# Patient Record
Sex: Male | Born: 1980 | Race: Black or African American | Hispanic: No | Marital: Married | Smoking: Never smoker
Health system: Southern US, Community
[De-identification: ages and names within clinical notes are randomized; demographics above are authoritative.]

## PROBLEM LIST (undated history)

## (undated) DIAGNOSIS — I10 Essential (primary) hypertension: Secondary | ICD-10-CM

---

## 2004-07-19 ENCOUNTER — Emergency Department (HOSPITAL_COMMUNITY): Admission: EM | Admit: 2004-07-19 | Discharge: 2004-07-19 | Payer: Self-pay | Admitting: Emergency Medicine

## 2006-12-01 ENCOUNTER — Emergency Department (HOSPITAL_COMMUNITY): Admission: EM | Admit: 2006-12-01 | Discharge: 2006-12-01 | Payer: Self-pay | Admitting: Emergency Medicine

## 2007-06-11 IMAGING — CR DG ABDOMEN ACUTE W/ 1V CHEST
3 series · 3 of 3 positions shown · non-contrast
Comparison: None

CLINICAL DATA: Abdominal pain for one week. Some nausea.

ABDOMEN SERIES - 2 VIEW & CHEST - 1 VIEW

[w chest pa]
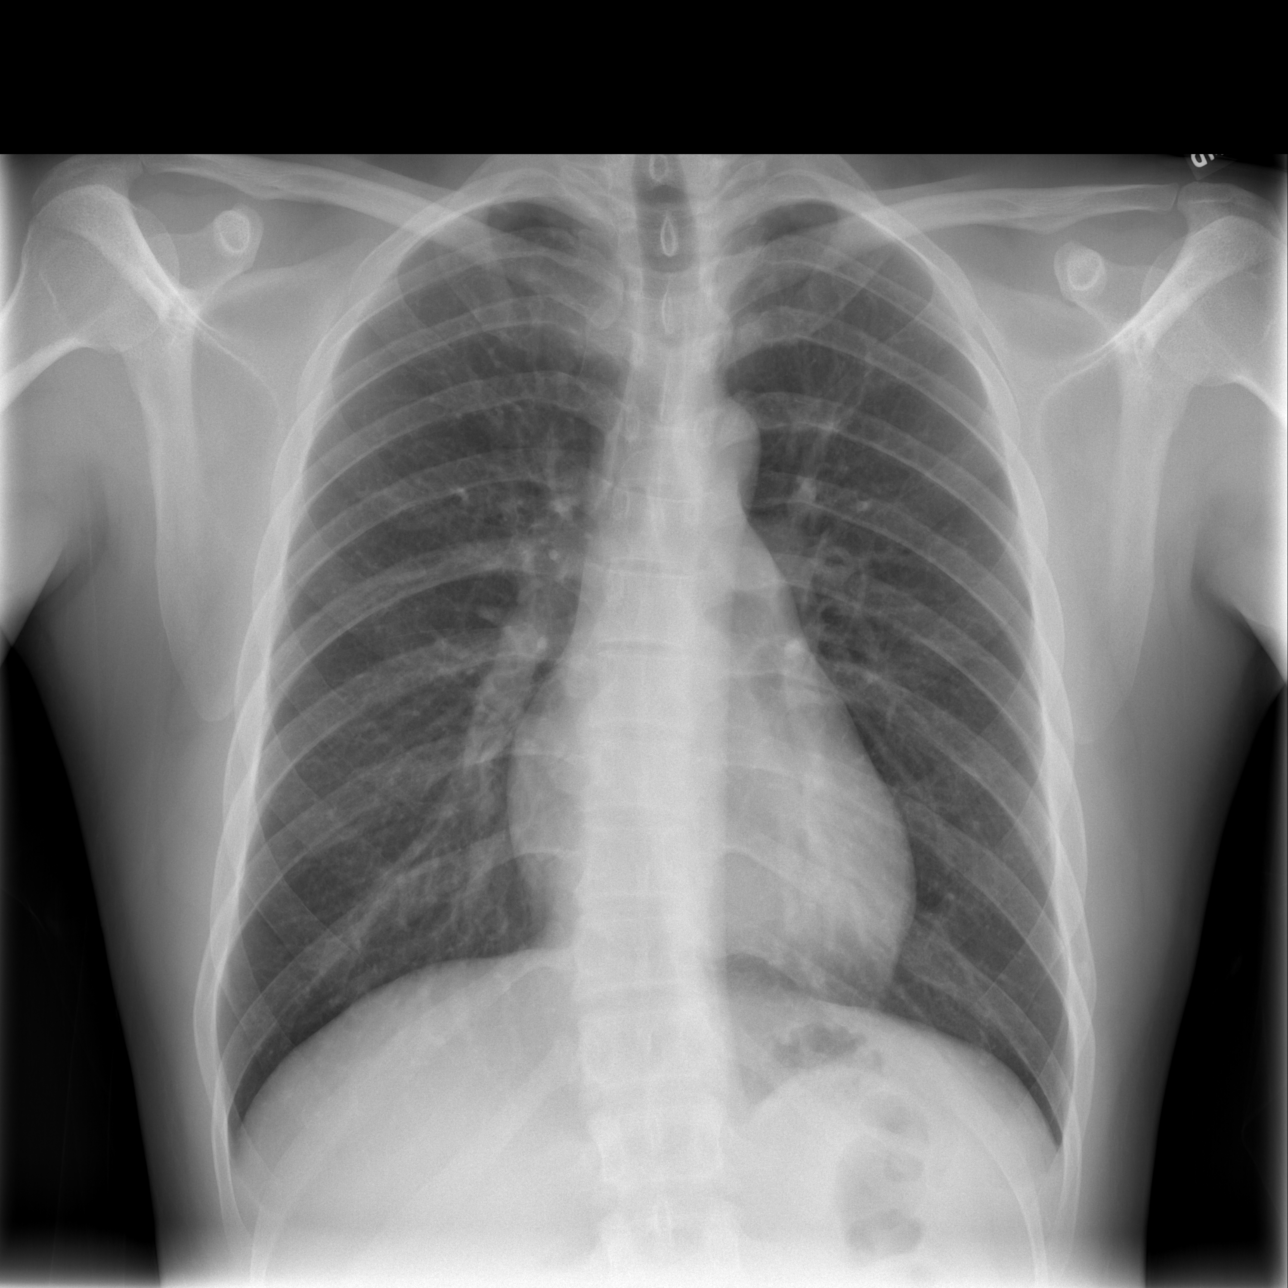

[w abdomen upright *]
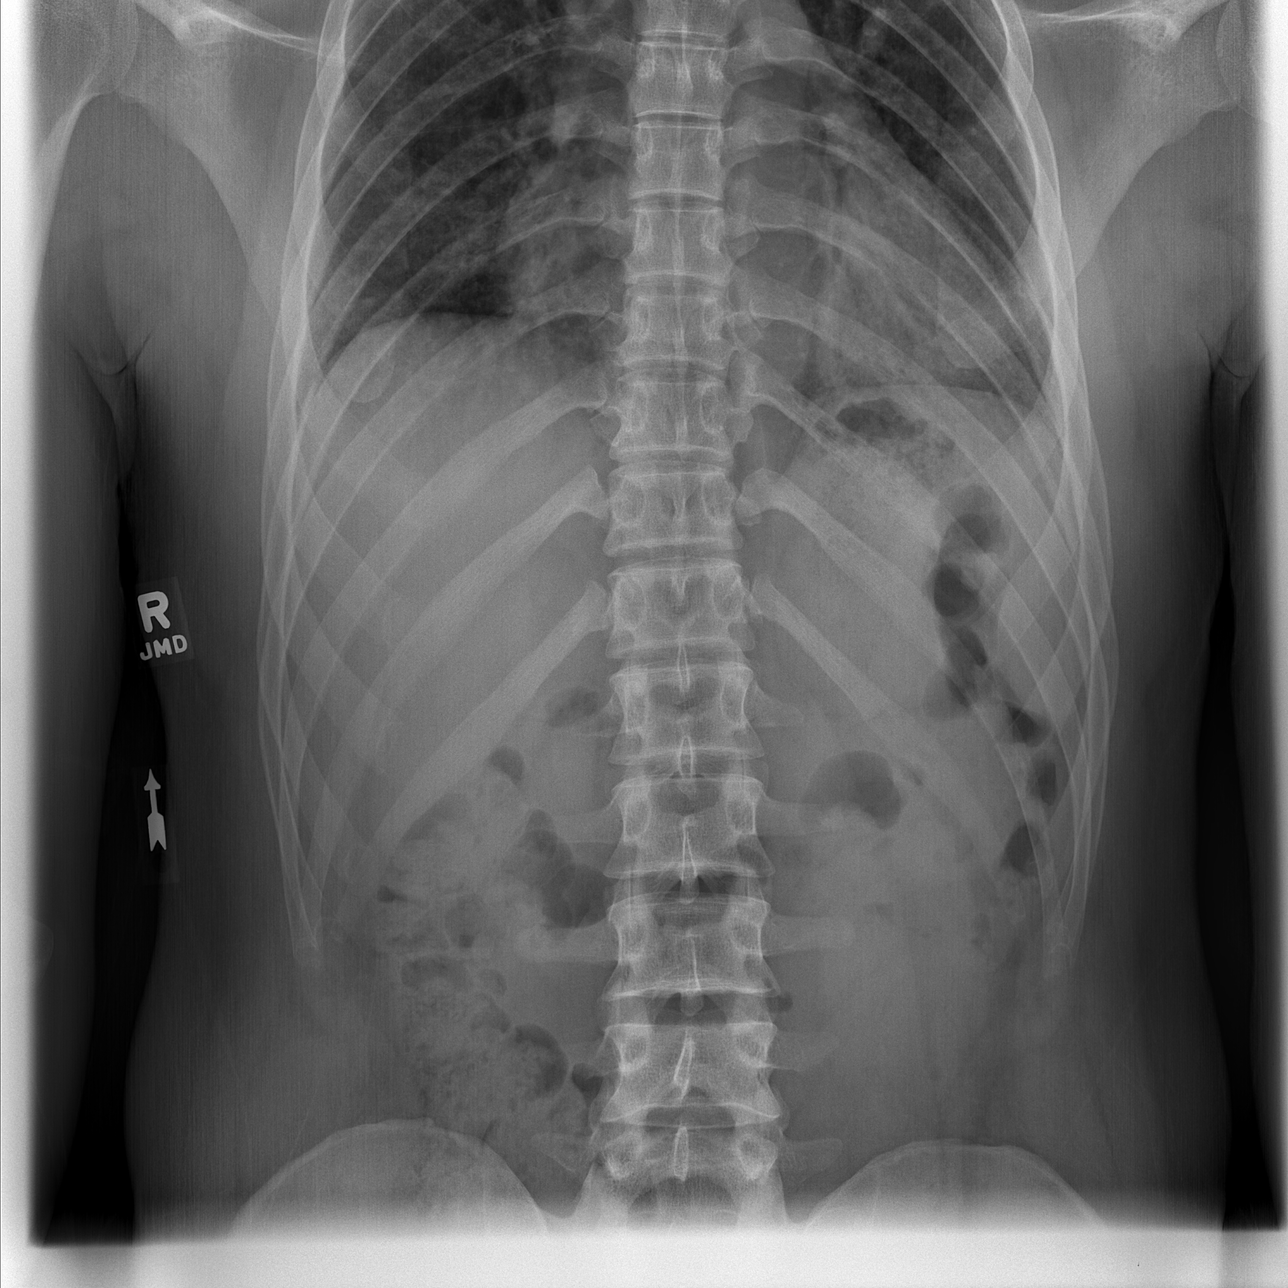

[t abdomen supine]
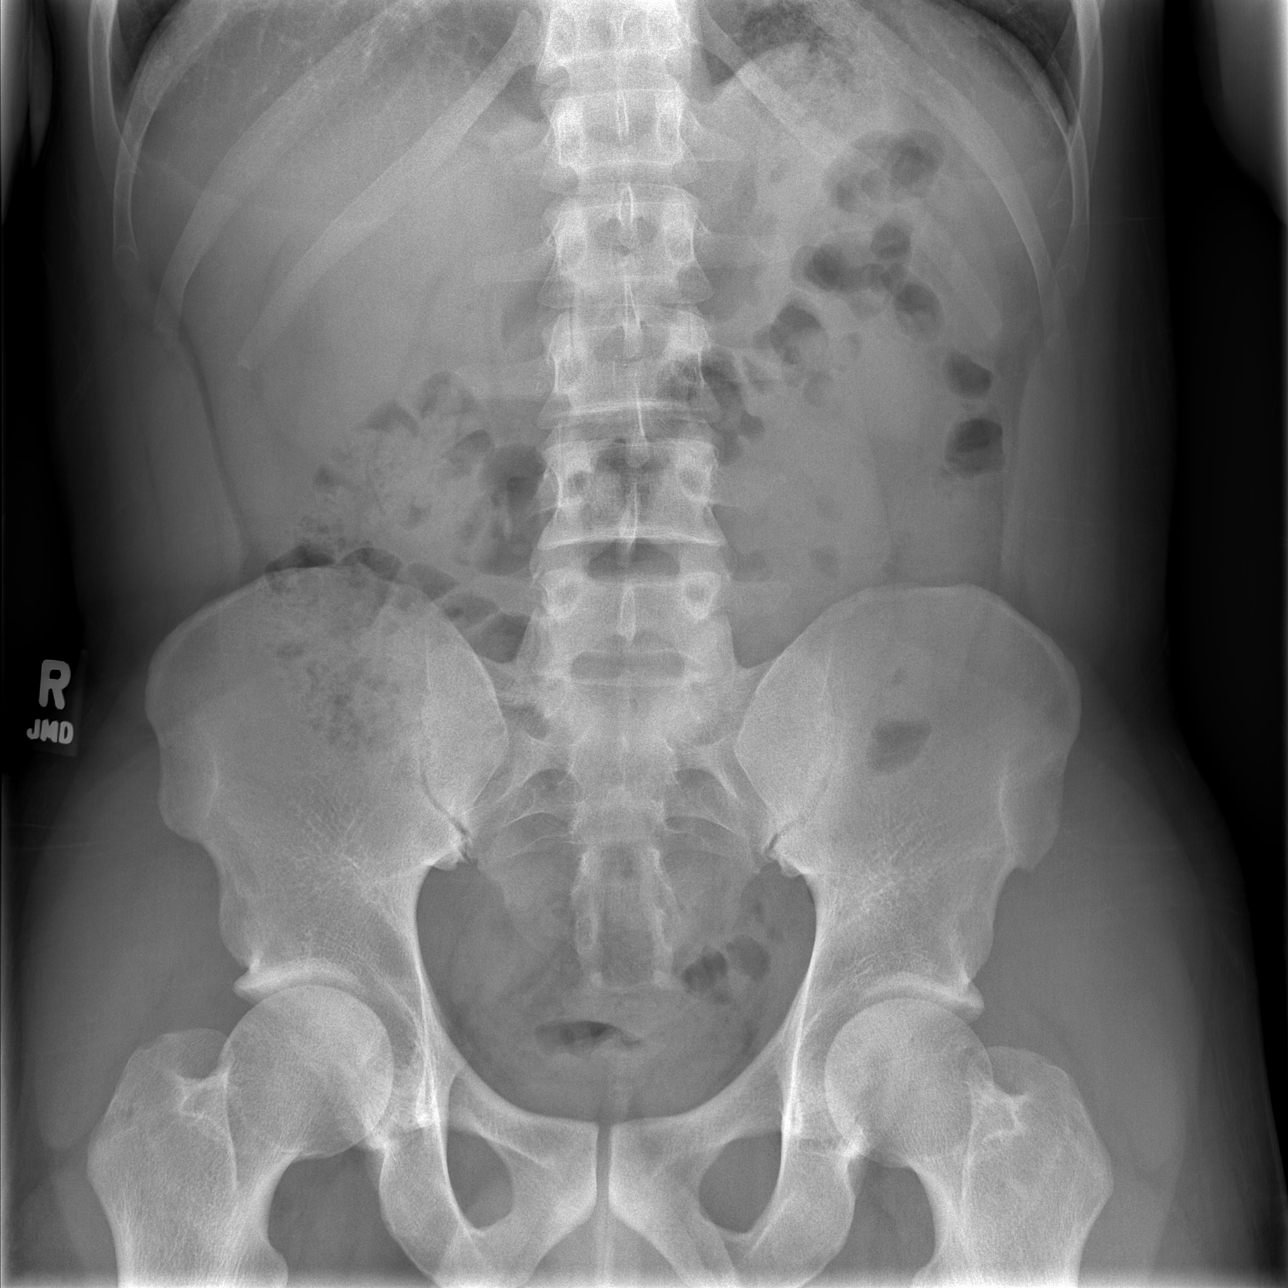

[3 of 3 positions shown; findings below may reference images not displayed]

FINDINGS: Frontal view of the chest demonstrates minimal convex right spinal
curvature. Midline trachea. Normal heart size and mediastinal contours. Clear
lungs.

Abdominal films demonstrate no free intraperitoneal air. No significant
air-fluid levels. No abnormal abdominal calcifications. Distal gas identified.

IMPRESSION

No acute findings.

## 2009-08-13 ENCOUNTER — Emergency Department (HOSPITAL_COMMUNITY): Admission: EM | Admit: 2009-08-13 | Discharge: 2009-08-14 | Payer: Self-pay | Admitting: Emergency Medicine

## 2009-10-26 ENCOUNTER — Encounter: Admission: RE | Admit: 2009-10-26 | Discharge: 2009-10-26 | Payer: Self-pay | Admitting: Family Medicine

## 2010-05-06 IMAGING — CR DG CHEST 2V
2 series · 2 of 2 positions shown · non-contrast
Comparison: None.

CLINICAL DATA: Chest pressure, cough

CHEST - 2 VIEW

[view not recorded (1 of 2)]
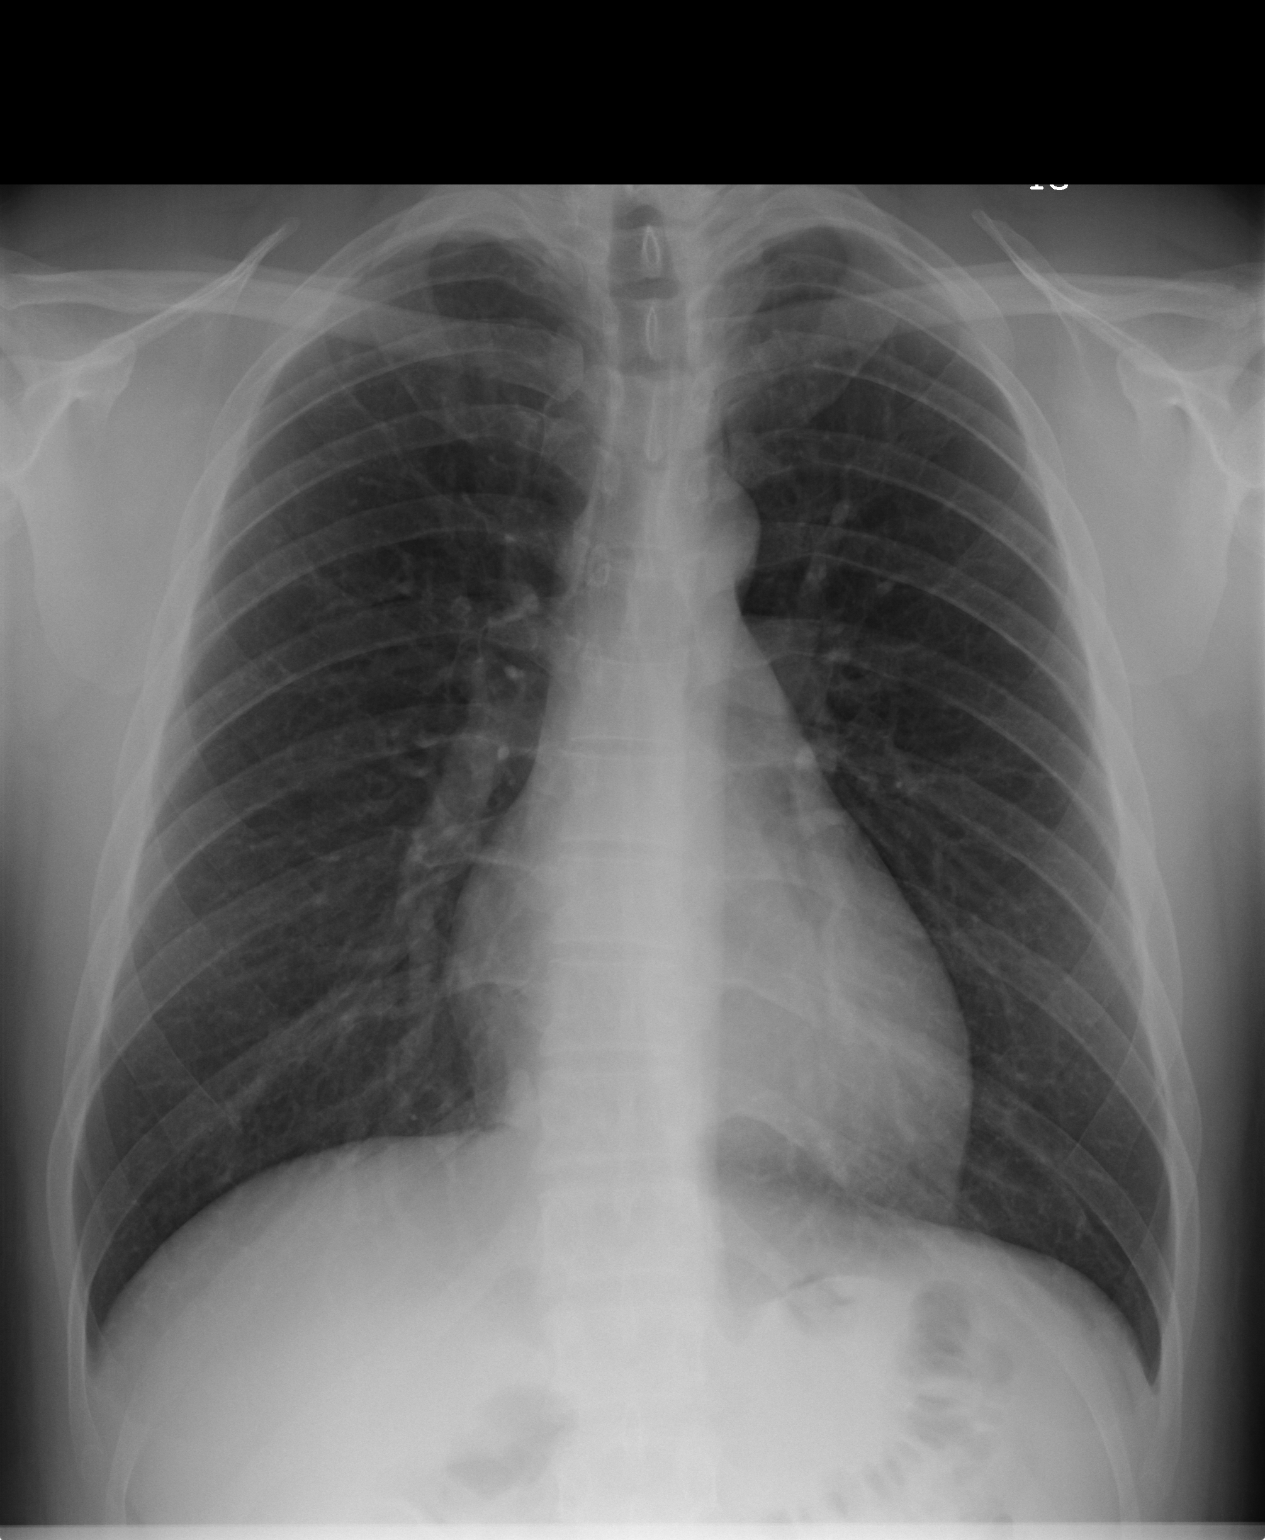

[view not recorded (2 of 2)]
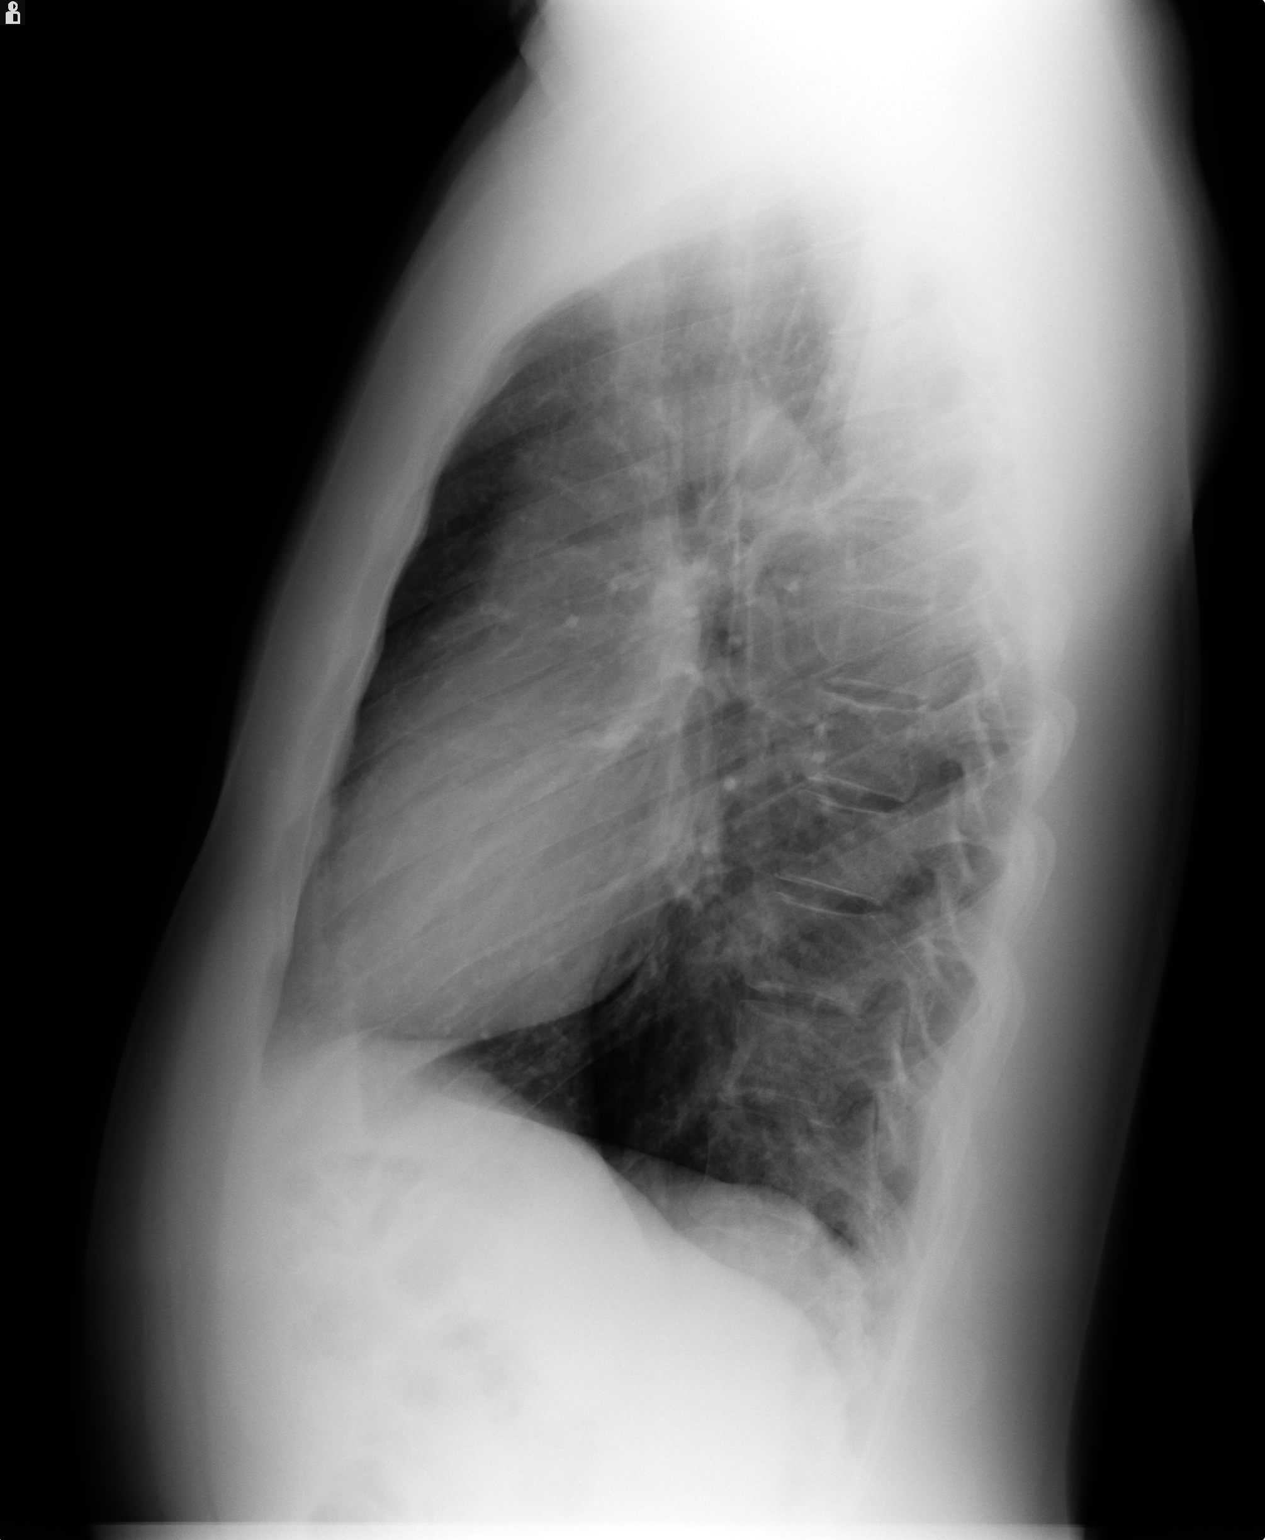

[2 of 2 positions shown; findings below may reference images not displayed]

FINDINGS: The lungs are clear.  Mediastinal contours are normal.
The heart is within normal limits in size.  No bony abnormality is
seen.
IMPRESSION: No active lung disease.

## 2013-07-21 ENCOUNTER — Ambulatory Visit: Payer: Self-pay | Admitting: Internal Medicine

## 2013-08-05 ENCOUNTER — Encounter (HOSPITAL_COMMUNITY): Payer: Self-pay | Admitting: Emergency Medicine

## 2013-08-05 ENCOUNTER — Emergency Department (HOSPITAL_COMMUNITY)
Admission: EM | Admit: 2013-08-05 | Discharge: 2013-08-05 | Disposition: A | Discharge status: Home / Self Care | Payer: BC Managed Care – PPO | Source: Home / Self Care

## 2013-08-05 DIAGNOSIS — R0982 Postnasal drip: Secondary | ICD-10-CM

## 2013-08-05 LAB — POCT RAPID STREP A: STREPTOCOCCUS, GROUP A SCREEN (DIRECT): NEGATIVE

## 2013-08-05 NOTE — ED Provider Notes (Signed)
CSN: [id number]     Arrival date & time 08/05/13  0811 History   None    Chief Complaint  Patient presents with  . Lymphadenopathy    swollen glands   (Consider location/radiation/quality/duration/timing/severity/associated sxs/prior Treatment) HPI Comments: 33 year old male awoke this morning and felt as though he had enlarged lymph nodes on the sides of his neck. He denies sore throat, fever, earache, congestion or fatigue or malaise. He says he feels well otherwise. 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   History reviewed. No pertinent past medical history. History reviewed. No pertinent past surgical history. History reviewed. No pertinent family history. History  Substance Use Topics  . Smoking status: Never Smoker   . Smokeless tobacco: Not on file  . Alcohol Use: No    Review of Systems  Constitutional: Negative.   HENT: Negative.   Respiratory: Negative.   Gastrointestinal: Negative.   Musculoskeletal: Negative.   Neurological: Negative.   Psychiatric/Behavioral: Negative.     Allergies  Review of patient's allergies indicates no known allergies.  Home Medications  No current outpatient prescriptions on file. BP 126/80  Pulse 72  Temp(Src) 97.9 F (36.6 C) (Oral)  Resp 14  SpO2 98% Physical Exam  Nursing note and vitals reviewed. Constitutional: He is oriented to person, place, and time. He appears well-nourished. No distress.  HENT:  Mouth/Throat: No oropharyngeal exudate.  Right TM is normal, left TM is retracted. No erythema. Oropharynx minor erythema and copious amount of PND with cobblestoning.  Eyes: Conjunctivae and EOM are normal.  Neck: Normal range of motion. Neck supple.  Palpation reveals no palpable anterior or cervical lymphadenopathy. No small nodes are palpable. There are 2 small areas of tenderness to the anterior neck just opposite of the oropharynx. When pressed inward he states that his  throat feels sore.  Cardiovascular: Normal rate and normal heart sounds.   Pulmonary/Chest: Effort normal and breath sounds normal.  Lymphadenopathy:    He has no cervical adenopathy.  Neurological: He is alert and oriented to person, place, and time.  Skin: Skin is warm and dry.  Psychiatric: He has a normal mood and affect.    ED Course  Procedures (including critical care time) Labs Review Labs Reviewed  POCT RAPID STREP A ([facility] ONLY)   Imaging Review No results found. Results for orders placed during the hospital encounter of 08/05/13  POCT RAPID STREP A ([facility] ONLY)      Result Value Range   Streptococcus, Group A Screen (Direct) NEGATIVE  NEGATIVE      MDM   1. PND (post-nasal drip)      Allegra 180 Mg q d prn Reassurance For worsening may return or see your PCP     Hayden Rasmussenavid Jiyaan Steinhauser, NP 08/05/13 [phone]

## 2013-08-05 NOTE — Discharge Instructions (Signed)
Upper Respiratory Infection, Adult This may be due to allergies or early URI For now recommend Allegra 180 mg every day for drainage An upper respiratory infection (URI) is also known as the common cold. It is often caused by a type of germ (virus). Colds are easily spread (contagious). You can pass it to others by kissing, coughing, sneezing, or drinking out of the same glass. Usually, you get better in 1 or 2 weeks.  HOME CARE   Only take medicine as told by your doctor.  Use a warm mist humidifier or breathe in steam from a hot shower.  Drink enough water and fluids to keep your pee (urine) clear or pale yellow.  Get plenty of rest.  Return to work when your temperature is back to normal or as told by your doctor. You may use a face mask and wash your hands to stop your cold from spreading. GET HELP RIGHT AWAY IF:   After the first few days, you feel you are getting worse.  You have questions about your medicine.  You have chills, shortness of breath, or brown or red spit (mucus).  You have yellow or brown snot (nasal discharge) or pain in the face, especially when you bend forward.  You have a fever, puffy (swollen) neck, pain when you swallow, or white spots in the back of your throat.  You have a bad headache, ear pain, sinus pain, or chest pain.  You have a high-pitched whistling sound when you breathe in and out (wheezing).  You have a lasting cough or cough up blood.  You have sore muscles or a stiff neck. MAKE SURE YOU:   Understand these instructions.  Will watch your condition.  Will get help right away if you are not doing well or get worse. Document Released: 12/06/2007 Document Revised: 09/11/2011 Document Reviewed: 10/24/2010 [facility] Patient Information 2014 [place], [place].

## 2013-08-05 NOTE — ED Notes (Signed)
Reports waking with swollen glands.  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  Also voices concerns about blood pressure.  States"blood pressure was elevated yesterday".  No use of otc meds for symptoms.

## 2013-08-07 LAB — CULTURE, GROUP A STREP

## 2013-08-08 NOTE — ED Provider Notes (Signed)
Medical screening examination/treatment/procedure(s) were performed by resident physician or non-physician practitioner and as supervising physician I was immediately available for consultation/collaboration.   KINDL,JAMES DOUGLAS MD.   James D Kindl, MD 08/08/13 1058 

## 2018-06-20 ENCOUNTER — Encounter (HOSPITAL_BASED_OUTPATIENT_CLINIC_OR_DEPARTMENT_OTHER): Payer: Self-pay | Admitting: Emergency Medicine

## 2018-06-20 ENCOUNTER — Other Ambulatory Visit: Payer: Self-pay

## 2018-06-20 ENCOUNTER — Emergency Department (HOSPITAL_BASED_OUTPATIENT_CLINIC_OR_DEPARTMENT_OTHER)
Admission: EM | Admit: 2018-06-20 | Discharge: 2018-06-20 | Disposition: A | Discharge status: Home / Self Care | Payer: BC Managed Care – PPO | Attending: Emergency Medicine | Admitting: Emergency Medicine

## 2018-06-20 DIAGNOSIS — J069 Acute upper respiratory infection, unspecified: Secondary | ICD-10-CM | POA: Insufficient documentation

## 2018-06-20 DIAGNOSIS — J189 Pneumonia, unspecified organism: Secondary | ICD-10-CM | POA: Insufficient documentation

## 2018-06-20 MED ORDER — AZITHROMYCIN 250 MG PO TABS: 250.0000 mg | ORAL_TABLET | Freq: Every day | ORAL | 0 refills | Status: DC

## 2018-06-20 NOTE — ED Provider Notes (Signed)
[facility] EMERGENCY DEPARTMENT Provider Note   CSN: [id number] Arrival date & time: 06/20/18  [id number]     History   Chief Complaint Chief Complaint  Patient presents with  . URI    HPI Jared Gates is a 37 y.o. male.  HPI   Friday had nasal congestion, by Monday had fever then started feeling better then got worse.  This AM woke up felt like chest caved in and congestion in nose.  Cough began Sunday and Monday, has been getting worse, dry cough. Severe coughing with sore throat.   No nausea or vomiting, no body aches Had diarrhea yesterday, took amoxicillin 2 pills, then had diarrhea.  Took prednisone x2.  Felt like prednisone helped, took it day before yesterday.   No ear pain Is having sinus pressure.  History reviewed. No pertinent past medical history.  There are no active problems to display for this patient.   History reviewed. No pertinent surgical history.      Home Medications    Prior to Admission medications   Medication Sig Start Date End Date Taking? Authorizing Provider  azithromycin (ZITHROMAX) 250 MG tablet Take 1 tablet (250 mg total) by mouth daily. Take first 2 tablets together, then 1 every day until finished. 06/20/18   Alvira MondaySchlossman, [name], MD    Family History No family history on file.  Social History Social History   Tobacco Use  . Smoking status: Never Smoker  . Smokeless tobacco: Never Used  Substance Use Topics  . Alcohol use: No  . Drug use: No     Allergies   Patient has no known allergies.   Review of Systems Review of Systems  Constitutional: Positive for chills, fatigue and fever.  HENT: Positive for congestion, rhinorrhea and sore throat. Negative for ear pain.   Eyes: Negative for visual disturbance.  Respiratory: Positive for cough and chest tightness (with cough). Negative for shortness of breath.   Cardiovascular: Negative for chest pain.  Gastrointestinal: Positive for diarrhea. Negative for  abdominal pain, nausea and vomiting.  Genitourinary: Negative for difficulty urinating.  Musculoskeletal: Negative for back pain and neck stiffness.  Skin: Negative for rash.  Neurological: Positive for light-headedness. Negative for syncope and headaches.     Physical Exam Updated Vital Signs BP 130/80 (BP Location: Right Arm)   Pulse 83   Temp 100.3 F (37.9 C) (Oral)   Resp 18   Ht 5\' 8"  (1.727 m)   Wt 96.6 kg   SpO2 96%   BMI 32.39 kg/m   Physical Exam Vitals signs and nursing note reviewed.  Constitutional:      General: He is not in acute distress.    Appearance: He is well-developed. He is not diaphoretic.  HENT:     Head: Normocephalic and atraumatic.     Nose: Rhinorrhea present.     Mouth/Throat:     Mouth: Mucous membranes are moist.     Pharynx: No oropharyngeal exudate or posterior oropharyngeal erythema.  Eyes:     Conjunctiva/sclera: Conjunctivae normal.  Neck:     Musculoskeletal: Normal range of motion.  Cardiovascular:     Rate and Rhythm: Normal rate and regular rhythm.     Heart sounds: Normal heart sounds. No murmur. No friction rub. No gallop.   Pulmonary:     Effort: Pulmonary effort is normal. No respiratory distress.     Breath sounds: Normal breath sounds. No wheezing or rales.  Abdominal:     General: There is no distension.  Palpations: Abdomen is soft.     Tenderness: There is no abdominal tenderness. There is no guarding.  Skin:    General: Skin is warm and dry.  Neurological:     Mental Status: He is alert and oriented to person, place, and time.      ED Treatments / Results  Labs (all labs ordered are listed, but only abnormal results are displayed) Labs Reviewed - No data to display  EKG None  Radiology No results found.  Procedures Procedures (including critical care time)  Medications Ordered in ED Medications - No data to display   Initial Impression / Assessment and Plan / ED Course  I have reviewed the  triage vital signs and the nursing notes.  Pertinent labs & imaging results that were available during my care of the patient were reviewed by me and considered in my medical decision making (see chart for details).     37yo male presents with concern for worsening cough, congestion and fevers after 1 week of cold like symptoms.  No body aches, do not suspect influenza. No signs of asthma. Breath sounds bilaterally, no sign of pneumothorax.  Clinically picture concerning for CAP with cough for one week with improvement followed by worsening. Discussed option of getting CXR versus treating clinically for pneumonia with abx and pt prefers treatment for clinical concern.  Did discuss giving prednisone and tessalon however did not print rx. Attempted to call to get rx to pharmacy. Did discuss treatment with abx alone is appropriate in this clinical scenario. Patient discharged in stable condition with understanding of reasons to return.   Final Clinical Impressions(s) / ED Diagnoses   Final diagnoses:  Community acquired pneumonia, unspecified laterality  Upper respiratory tract infection, unspecified type    ED Discharge Orders         Ordered    azithromycin (ZITHROMAX) 250 MG tablet  Daily     06/20/18 1019           Alvira MondaySchlossman, [name], MD 06/20/18 1053

## 2018-06-20 NOTE — ED Triage Notes (Addendum)
States has been having a cough since last Friday and congestion . Non---productive cough, took 2 doses of friend's Amoxicillin and prednisone

## 2019-06-05 ENCOUNTER — Encounter: Payer: Self-pay | Admitting: Physician Assistant

## 2019-06-05 ENCOUNTER — Ambulatory Visit: Payer: Self-pay | Admitting: Physician Assistant

## 2019-06-05 ENCOUNTER — Other Ambulatory Visit: Payer: Self-pay

## 2019-06-05 DIAGNOSIS — Z113 Encounter for screening for infections with a predominantly sexual mode of transmission: Secondary | ICD-10-CM

## 2019-06-05 DIAGNOSIS — L209 Atopic dermatitis, unspecified: Secondary | ICD-10-CM | POA: Insufficient documentation

## 2019-06-05 LAB — GRAM STAIN

## 2019-06-05 NOTE — Progress Notes (Signed)
Gram Stain results reviewed. Per standing orders no treatment indicated. Kamiya Acord, RN ° °

## 2019-06-05 NOTE — Progress Notes (Signed)
    STI clinic/screening visit  Subjective:  Jared Gates is a 38 y.o. male being seen today for an STI screening visit. The patient reports they do have symptoms.  Patient reports that they do not desire a pregnancy in the next year.   They reported they are not interested in discussing contraception today.   Patient has the following medical conditions:  There are no active problems to display for this patient.    Chief Complaint  Patient presents with  . SEXUALLY TRANSMITTED DISEASE    STD screening, declines bloodwork    HPI  Patient reports white urethral discharge for 2 days.  See flowsheet for further details and programmatic requirements.    The following portions of the patient's history were reviewed and updated as appropriate: allergies, current medications, past medical history, past social history, past surgical history and problem list.  Objective:  There were no vitals filed for this visit.  Physical Exam Constitutional:      Appearance: Normal appearance.  HENT:     Head: Normocephalic and atraumatic.     Comments: No nits or hair loss    Mouth/Throat:     Mouth: Mucous membranes are moist.     Pharynx: Oropharynx is clear. No oropharyngeal exudate or posterior oropharyngeal erythema.  Pulmonary:     Effort: Pulmonary effort is normal.  Abdominal:     General: Abdomen is flat.     Palpations: Abdomen is soft. There is no hepatomegaly or mass.     Tenderness: There is no abdominal tenderness.  Genitourinary:    Pubic Area: No rash or pubic lice.      Penis: Normal.      Scrotum/Testes: Normal.     Epididymis:     Right: Normal.     Left: Normal.     Rectum: Normal.  Lymphadenopathy:     Head:     Right side of head: No preauricular or posterior auricular adenopathy.     Left side of head: No preauricular or posterior auricular adenopathy.     Cervical: No cervical adenopathy.     Upper Body:     Right upper body: No supraclavicular or  axillary adenopathy.     Left upper body: No supraclavicular or axillary adenopathy.     Lower Body: No right inguinal adenopathy. No left inguinal adenopathy.  Skin:    General: Skin is warm and dry.     Findings: Rash present. Rash is macular.     Comments: Hyperpigmented skin on back in areas he states he has chronic exzema  Neurological:     Mental Status: He is alert and oriented to person, place, and time.       Assessment and Plan:  Jared Gates is a 38 y.o. male presenting to the [facility] Department for STI screening  1. Routine screening for STI (sexually transmitted infection) Gram stain = neg. Await GC culture result. Push fluids, enc consistent condom use.     Return if symptoms worsen or fail to improve.  No future appointments.  Lora Havens, PA-C

## 2019-06-10 LAB — GONOCOCCUS CULTURE

## 2019-12-21 ENCOUNTER — Other Ambulatory Visit: Payer: Self-pay

## 2019-12-21 ENCOUNTER — Emergency Department (HOSPITAL_BASED_OUTPATIENT_CLINIC_OR_DEPARTMENT_OTHER)
Admission: EM | Admit: 2019-12-21 | Discharge: 2019-12-21 | Disposition: A | Discharge status: Home / Self Care | Payer: Self-pay | Attending: Emergency Medicine | Admitting: Emergency Medicine

## 2019-12-21 ENCOUNTER — Encounter (HOSPITAL_BASED_OUTPATIENT_CLINIC_OR_DEPARTMENT_OTHER): Payer: Self-pay | Admitting: Emergency Medicine

## 2019-12-21 DIAGNOSIS — S86912A Strain of unspecified muscle(s) and tendon(s) at lower leg level, left leg, initial encounter: Secondary | ICD-10-CM | POA: Insufficient documentation

## 2019-12-21 DIAGNOSIS — Y9389 Activity, other specified: Secondary | ICD-10-CM | POA: Insufficient documentation

## 2019-12-21 DIAGNOSIS — Y30XXXA Falling, jumping or pushed from a high place, undetermined intent, initial encounter: Secondary | ICD-10-CM | POA: Insufficient documentation

## 2019-12-21 DIAGNOSIS — S86812A Strain of other muscle(s) and tendon(s) at lower leg level, left leg, initial encounter: Secondary | ICD-10-CM

## 2019-12-21 DIAGNOSIS — Y9289 Other specified places as the place of occurrence of the external cause: Secondary | ICD-10-CM | POA: Insufficient documentation

## 2019-12-21 DIAGNOSIS — Y998 Other external cause status: Secondary | ICD-10-CM | POA: Insufficient documentation

## 2019-12-21 HISTORY — DX: Essential (primary) hypertension: I10

## 2019-12-21 NOTE — ED Notes (Signed)
ED Provider at bedside. 

## 2019-12-21 NOTE — ED Triage Notes (Signed)
Pt c/o left lower leg and knee pain and swelling x 8 days. Pt denies injury. Pt states weight bearing increases pain

## 2019-12-21 NOTE — ED Provider Notes (Signed)
[place] EMERGENCY DEPARTMENT Provider Note   CSN: [id number] Arrival date & time: 12/21/19  1204     History Chief Complaint  Patient presents with  . Leg Pain    Jared Gates is a 39 y.o. male.  39 yo M with a cc of left leg pain.  Medial aspect of the calf up to the knee.  He denies obvious injury.  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  States he needs to get up to work to step on the way down.  He also is a Games developer and works Hydrologist.  He wonders if maybe he is injured it that way.  Had some significant swelling initially which is mildly improved.  Is been icing it off and on.  Denies fevers.  The history is provided by the patient.  Leg Pain Location:  Leg Time since incident:  9 days Injury: no   Leg location:  L leg Pain details:    Quality:  Aching   Radiates to:  Does not radiate   Severity:  Moderate   Onset quality:  Gradual   Duration:  9 days   Timing:  Constant   Progression:  Worsening Chronicity:  New Prior injury to area:  No Relieved by:  Ice Worsened by:  Bearing weight Ineffective treatments:  None tried Associated symptoms: no fever        Past Medical History:  Diagnosis Date  . Hypertension     Patient Active Problem List   Diagnosis Date Noted  . Atopic dermatitis 06/05/2019    History reviewed. No pertinent surgical history.     History reviewed. No pertinent family history.  Social History   Tobacco Use  . Smoking status: Never Smoker  . Smokeless tobacco: Never Used  Substance Use Topics  . Alcohol use: No  . Drug use: No    Home Medications Prior to Admission medications   Medication Sig Start Date End Date Taking? Authorizing Provider  azithromycin (ZITHROMAX) 250 MG tablet Take 1 tablet (250 mg total) by mouth daily. Take first 2 tablets together, then 1 every day until finished. 06/20/18   Gareth Morgan, MD    Allergies    Patient has no known allergies.  Review  of Systems   Review of Systems  Constitutional: Negative for chills and fever.  HENT: Negative for congestion and facial swelling.   Eyes: Negative for discharge and visual disturbance.  Respiratory: Negative for shortness of breath.   Cardiovascular: Negative for chest pain and palpitations.  Gastrointestinal: Negative for abdominal pain, diarrhea and vomiting.  Musculoskeletal: Positive for arthralgias and myalgias.  Skin: Negative for color change and rash.  Neurological: Negative for tremors, syncope and headaches.  Psychiatric/Behavioral: Negative for confusion and dysphoric mood.    Physical Exam Updated Vital Signs BP (!) 146/87 (BP Location: Right Arm)   Pulse 65   Temp 98.1 F (36.7 C) (Oral)   Resp 16   SpO2 99%   Physical Exam Vitals and nursing note reviewed.  Constitutional:      Appearance: He is well-developed.  HENT:     Head: Normocephalic and atraumatic.  Eyes:     Pupils: Pupils are equal, round, and reactive to light.  Neck:     Vascular: No JVD.  Cardiovascular:     Rate and Rhythm: Normal rate and regular rhythm.     Heart sounds: No murmur heard.  No friction rub. No gallop.   Pulmonary:  Effort: No respiratory distress.     Breath sounds: No wheezing.  Abdominal:     General: There is no distension.     Tenderness: There is no guarding or rebound.  Musculoskeletal:        General: Normal range of motion.     Cervical back: Normal range of motion and neck supple.     Comments: Pulse motor and sensation are intact to the left lower extremity.  He has full range of motion of the left knee passively.  Negative McMurray's, no obvious ligamentous laxity to the knee.  Achilles is intact.  He has some discomfort along the medial aspect of the calf muscle as it attaches to the medial femur.  No obvious edema when compared to the other side.  Skin:    Coloration: Skin is not pale.     Findings: No rash.  Neurological:     Mental Status: He is alert  and oriented to person, place, and time.  Psychiatric:        Behavior: Behavior normal.     ED Results / Procedures / Treatments   Labs (all labs ordered are listed, but only abnormal results are displayed) Labs Reviewed - No data to display  EKG None  Radiology No results found.  Procedures Procedures (including critical care time)  Medications Ordered in ED Medications - No data to display  ED Course  I have reviewed the triage vital signs and the nursing notes.  Pertinent labs & imaging results that were available during my care of the patient were reviewed by me and considered in my medical decision making (see chart for details).    MDM Rules/Calculators/A&P                          39 yo M with a chief complaints of left lower leg pain.  Most likely this is a muscular strain based on history and physical.  Will make him nonweightbearing have him treat with Tylenol ibuprofen rest and elevation.  Given sports medicine follow-up if needed.  1:09 PM:  I have discussed the diagnosis/risks/treatment options with the patient and family and believe the pt to be eligible for discharge home to follow-up with PCP, sports med. We also discussed returning to the ED immediately if new or worsening sx occur. We discussed the sx which are most concerning (e.g., sudden worsening pain, fever, inability to tolerate by mouth) that necessitate immediate return. Medications administered to the patient during their visit and any new prescriptions provided to the patient are listed below.  Medications given during this visit Medications - No data to display   The patient appears reasonably screen and/or stabilized for discharge and I doubt any other medical condition or other [facility] requiring further screening, evaluation, or treatment in the ED at this time prior to discharge.   Final Clinical Impression(s) / ED Diagnoses Final diagnoses:  Strain of calf muscle, left, initial encounter     Rx / DC Orders ED Discharge Orders    None       Melene Plan, DO 12/21/19 1309

## 2019-12-21 NOTE — Discharge Instructions (Signed)
Take 4 over the counter ibuprofen tablets 3 times a day or 2 over-the-counter naproxen tablets twice a day for pain. Also take tylenol 1000mg(2 extra strength) four times a day.    

## 2019-12-23 ENCOUNTER — Ambulatory Visit: Payer: Self-pay | Admitting: Family Medicine

## 2019-12-23 NOTE — Progress Notes (Deleted)
  Jared Gates - 39 y.o. male MRN [id number]  Date of birth: Aug 23, 1980  SUBJECTIVE:  Including CC & ROS.  No chief complaint on file.   Jared Gates is a 39 y.o. male that is  ***.  ***   Review of Systems See HPI   HISTORY: Past Medical, Surgical, Social, and Family History Reviewed & Updated per EMR.   Pertinent Historical Findings include:  Past Medical History:  Diagnosis Date  . Hypertension     No past surgical history on file.  No family history on file.  Social History   Socioeconomic History  . Marital status: Married    Spouse name: Not on file  . Number of children: Not on file  . Years of education: Not on file  . Highest education level: Not on file  Occupational History  . Not on file  Tobacco Use  . Smoking status: Never Smoker  . Smokeless tobacco: Never Used  Substance and Sexual Activity  . Alcohol use: No  . Drug use: No  . Sexual activity: Yes    Birth control/protection: Condom  Other Topics Concern  . Not on file  Social History Narrative  . Not on file   Social Determinants of Health   Financial Resource Strain:   . Difficulty of Paying Living Expenses:   Food Insecurity:   . Worried About Programme researcher, broadcasting/film/video in the Last Year:   . Barista in the Last Year:   Transportation Needs:   . Freight forwarder (Medical):   Marland Kitchen Lack of Transportation (Non-Medical):   Physical Activity:   . Days of Exercise per Week:   . Minutes of Exercise per Session:   Stress:   . Feeling of Stress :   Social Connections:   . Frequency of Communication with Friends and Family:   . Frequency of Social Gatherings with Friends and Family:   . Attends Religious Services:   . Active Member of Clubs or Organizations:   . Attends Banker Meetings:   Marland Kitchen Marital Status:   Intimate Partner Violence:   . Fear of Current or Ex-Partner:   . Emotionally Abused:   Marland Kitchen Physically Abused:   . Sexually Abused:      PHYSICAL  EXAM:  VS: There were no vitals taken for this visit. Physical Exam Gen: NAD, alert, cooperative with exam, well-appearing MSK:  ***      ASSESSMENT & PLAN:   No problem-specific Assessment & Plan notes found for this encounter.

## 2019-12-25 ENCOUNTER — Ambulatory Visit: Payer: Self-pay

## 2019-12-25 ENCOUNTER — Other Ambulatory Visit: Payer: Self-pay

## 2019-12-25 ENCOUNTER — Ambulatory Visit (INDEPENDENT_AMBULATORY_CARE_PROVIDER_SITE_OTHER): Payer: PRIVATE HEALTH INSURANCE | Admitting: Family Medicine

## 2019-12-25 VITALS — BP 143/93 | Ht 68.0 in | Wt 216.0 lb

## 2019-12-25 DIAGNOSIS — M25462 Effusion, left knee: Secondary | ICD-10-CM

## 2019-12-25 DIAGNOSIS — M25562 Pain in left knee: Secondary | ICD-10-CM

## 2019-12-25 MED ORDER — PREDNISONE 5 MG PO TABS: ORAL_TABLET | ORAL | 0 refills | Status: DC

## 2019-12-25 NOTE — Progress Notes (Signed)
Jared Gates - 39 y.o. male MRN [id number]  Date of birth: September 20, 1980  SUBJECTIVE:  Including CC & ROS.  No chief complaint on file.   Jared Gates is a 39 y.o. male that is presenting with left knee pain calf pain.  The pain is been ongoing for a few days.  Denies any specific inciting event.  It is worse when he is getting out of the truck.  He does work [employer].  It seems to be worse after he has been working for period of time.  No history of injury or surgery.   Review of Systems See HPI   HISTORY: Past Medical, Surgical, Social, and Family History Reviewed & Updated per EMR.   Pertinent Historical Findings include:  Past Medical History:  Diagnosis Date  . Hypertension     No past surgical history on file.  No family history on file.  Social History   Socioeconomic History  . Marital status: Married    Spouse name: Not on file  . Number of children: Not on file  . Years of education: Not on file  . Highest education level: Not on file  Occupational History  . Not on file  Tobacco Use  . Smoking status: Never Smoker  . Smokeless tobacco: Never Used  Substance and Sexual Activity  . Alcohol use: No  . Drug use: No  . Sexual activity: Yes    Birth control/protection: Condom  Other Topics Concern  . Not on file  Social History Narrative  . Not on file   Social Determinants of Health   Financial Resource Strain:   . Difficulty of Paying Living Expenses:   Food Insecurity:   . Worried About Programme researcher, broadcasting/film/video in the Last Year:   . Barista in the Last Year:   Transportation Needs:   . Freight forwarder (Medical):   Marland Kitchen Lack of Transportation (Non-Medical):   Physical Activity:   . Days of Exercise per Week:   . Minutes of Exercise per Session:   Stress:   . Feeling of Stress :   Social Connections:   . Frequency of Communication with Friends and Family:   . Frequency of Social Gatherings with Friends and Family:     . Attends Religious Services:   . Active Member of Clubs or Organizations:   . Attends Banker Meetings:   Marland Kitchen Marital Status:   Intimate Partner Violence:   . Fear of Current or Ex-Partner:   . Emotionally Abused:   Marland Kitchen Physically Abused:   . Sexually Abused:      PHYSICAL EXAM:  VS: BP (!) 143/93   Ht 5\' 8"  (1.727 m)   Wt 216 lb (98 kg)   BMI 32.84 kg/m  Physical Exam Gen: NAD, alert, cooperative with exam, well-appearing MSK:  Left knee: Effusion noted. Limited extension and flexion. No instability. Normal strength resistance. Negative McMurray's test. Neurovascularly intact  Limited ultrasound: Left knee:  Moderate effusion suprapatellar pouch. Normal-appearing quadricep patellar tendon. Normal-appearing medial lateral joint space. Moderate Baker's cyst on the posterior aspect.  Summary: Effusion and Baker's cyst  Ultrasound and interpretation by , MD    ASSESSMENT & PLAN:   Knee effusion, left Effusion and Baker's cyst were noted on ultrasound exam.  Possibly related to degenerative meniscus that he irritated.  He does do manual labor with building decks.  -Counseled on home exercise therapy and supportive care. -Prednisone. -Could consider imaging and aspiration injection.

## 2019-12-25 NOTE — Patient Instructions (Signed)
Nice to meet you Please try ice  Please try compression  Please try the medicine   Please send me a message in MyChart with any questions or updates.  Please see me back in 4 weeks.   --Dr. Jordan Likes

## 2019-12-26 DIAGNOSIS — M25462 Effusion, left knee: Secondary | ICD-10-CM | POA: Insufficient documentation

## 2019-12-26 NOTE — Assessment & Plan Note (Signed)
Effusion and Baker's cyst were noted on ultrasound exam.  Possibly related to degenerative meniscus that he irritated.  He does do manual labor with building decks.  -Counseled on home exercise therapy and supportive care. -Prednisone. -Could consider imaging and aspiration injection.

## 2019-12-31 ENCOUNTER — Encounter: Payer: Self-pay | Admitting: Family Medicine

## 2019-12-31 ENCOUNTER — Ambulatory Visit (INDEPENDENT_AMBULATORY_CARE_PROVIDER_SITE_OTHER): Payer: PRIVATE HEALTH INSURANCE | Admitting: Family Medicine

## 2019-12-31 ENCOUNTER — Other Ambulatory Visit: Payer: Self-pay

## 2019-12-31 ENCOUNTER — Ambulatory Visit: Payer: Self-pay

## 2019-12-31 VITALS — BP 139/86 | HR 68 | Ht 68.0 in | Wt 213.0 lb

## 2019-12-31 DIAGNOSIS — S83412A Sprain of medial collateral ligament of left knee, initial encounter: Secondary | ICD-10-CM | POA: Diagnosis not present

## 2019-12-31 DIAGNOSIS — S83242A Other tear of medial meniscus, current injury, left knee, initial encounter: Secondary | ICD-10-CM

## 2019-12-31 NOTE — Progress Notes (Signed)
Jared Gates - 39 y.o. male MRN [id number]  Date of birth: May 15, 1981  SUBJECTIVE:  Including CC & ROS.  Chief Complaint  Patient presents with  . Knee Injury    left x 12/30/2019    Jared Gates is a 39 y.o. male that is presenting with acute left knee pain.  He stepped back and twisted his knee.  Now he is having pain in the medial aspect of his knee.  Feels unsteady at times.  This feels different than his previous knee pain.   Review of Systems See HPI   HISTORY: Past Medical, Surgical, Social, and Family History Reviewed & Updated per EMR.   Pertinent Historical Findings include:  Past Medical History:  Diagnosis Date  . Hypertension     No past surgical history on file.  No family history on file.  Social History   Socioeconomic History  . Marital status: Married    Spouse name: Not on file  . Number of children: Not on file  . Years of education: Not on file  . Highest education level: Not on file  Occupational History  . Not on file  Tobacco Use  . Smoking status: Never Smoker  . Smokeless tobacco: Never Used  Substance and Sexual Activity  . Alcohol use: No  . Drug use: No  . Sexual activity: Yes    Birth control/protection: Condom  Other Topics Concern  . Not on file  Social History Narrative  . Not on file   Social Determinants of Health   Financial Resource Strain:   . Difficulty of Paying Living Expenses:   Food Insecurity:   . Worried About Programme researcher, broadcasting/film/video in the Last Year:   . Barista in the Last Year:   Transportation Needs:   . Freight forwarder (Medical):   Marland Kitchen Lack of Transportation (Non-Medical):   Physical Activity:   . Days of Exercise per Week:   . Minutes of Exercise per Session:   Stress:   . Feeling of Stress :   Social Connections:   . Frequency of Communication with Friends and Family:   . Frequency of Social Gatherings with Friends and Family:   . Attends Religious Services:   . Active Member of  Clubs or Organizations:   . Attends Banker Meetings:   Marland Kitchen Marital Status:   Intimate Partner Violence:   . Fear of Current or Ex-Partner:   . Emotionally Abused:   Marland Kitchen Physically Abused:   . Sexually Abused:      PHYSICAL EXAM:  VS: BP 139/86   Pulse 68   Ht 5\' 8"  (1.727 m)   Wt 213 lb (96.6 kg)   BMI 32.39 kg/m  Physical Exam Gen: NAD, alert, cooperative with exam, well-appearing MSK:  Left knee: No obvious effusion. Tenderness to palpation over the medial femoral condyle. Limitation with extension. Limitation with flexion. No instability. Negative McMurray's test. Neurovascular intact  Limited ultrasound: Left knee:  Moderate effusion in the suprapatellar pouch. Normal-appearing quadricep patellar tendon. Normal-appearing medial meniscus. Hyperemia associated with the origin of the MCL and medial retinaculum  Summary: Findings would suggest to MCL sprain  Ultrasound and interpretation by , MD    ASSESSMENT & PLAN:   Sprain of medial collateral ligament of left knee New injury that occurred yesterday.  Seems more of an MCL sprain and of his medial retinaculum. -Counseled on home exercise therapy and supportive care. -hinged knee brace. -If no improvement he consider injection,  physical therapy or MRI.

## 2019-12-31 NOTE — Assessment & Plan Note (Addendum)
New injury that occurred yesterday.  Seems more of an MCL sprain and of his medial retinaculum. -Counseled on home exercise therapy and supportive care. -hinged knee brace. -If no improvement he consider injection, physical therapy or MRI.

## 2019-12-31 NOTE — Patient Instructions (Signed)
Good to see you Please try the brace  Please try ice  Please try the range of motion and exercises when the pain improves   Please send me a message in MyChart with any questions or updates.  Please see me back in 4 weeks.   --Dr. Jordan Likes

## 2020-01-27 ENCOUNTER — Other Ambulatory Visit: Payer: Self-pay

## 2020-01-27 ENCOUNTER — Ambulatory Visit (INDEPENDENT_AMBULATORY_CARE_PROVIDER_SITE_OTHER): Payer: PRIVATE HEALTH INSURANCE | Admitting: Family Medicine

## 2020-01-27 ENCOUNTER — Ambulatory Visit: Payer: Self-pay

## 2020-01-27 ENCOUNTER — Encounter: Payer: Self-pay | Admitting: Family Medicine

## 2020-01-27 VITALS — BP 136/80 | HR 91 | Ht 68.0 in | Wt 215.0 lb

## 2020-01-27 DIAGNOSIS — M25462 Effusion, left knee: Secondary | ICD-10-CM

## 2020-01-27 DIAGNOSIS — S83412D Sprain of medial collateral ligament of left knee, subsequent encounter: Secondary | ICD-10-CM | POA: Diagnosis not present

## 2020-01-27 NOTE — Assessment & Plan Note (Signed)
Hyperemia still present at the origin of the MCL.  His pain has improved. -Counseled on home exercise therapy and supportive care. -Can continue the hinged knee brace. -Could consider nitro patches or physical therapy.

## 2020-01-27 NOTE — Progress Notes (Signed)
Jared Gates - 39 y.o. male MRN [id number]  Date of birth: 19-Jan-1981  SUBJECTIVE:  Including CC & ROS.  Chief Complaint  Patient presents with  . Follow-up    left knee    Jared Gates is a 39 y.o. male that is following up for his left knee pain.  He feels improvement with his range of motion.  Does have swelling from time to time.  Denies any locking.  Felt like his knee may give way.  Has some cramping in the calf.   Review of Systems See HPI   HISTORY: Past Medical, Surgical, Social, and Family History Reviewed & Updated per EMR.   Pertinent Historical Findings include:  Past Medical History:  Diagnosis Date  . Hypertension     No past surgical history on file.  No family history on file.  Social History   Socioeconomic History  . Marital status: Married    Spouse name: Not on file  . Number of children: Not on file  . Years of education: Not on file  . Highest education level: Not on file  Occupational History  . Not on file  Tobacco Use  . Smoking status: Never Smoker  . Smokeless tobacco: Never Used  Substance and Sexual Activity  . Alcohol use: No  . Drug use: No  . Sexual activity: Yes    Birth control/protection: Condom  Other Topics Concern  . Not on file  Social History Narrative  . Not on file   Social Determinants of Health   Financial Resource Strain:   . Difficulty of Paying Living Expenses:   Food Insecurity:   . Worried About Programme researcher, broadcasting/film/video in the Last Year:   . Barista in the Last Year:   Transportation Needs:   . Freight forwarder (Medical):   Marland Kitchen Lack of Transportation (Non-Medical):   Physical Activity:   . Days of Exercise per Week:   . Minutes of Exercise per Session:   Stress:   . Feeling of Stress :   Social Connections:   . Frequency of Communication with Friends and Family:   . Frequency of Social Gatherings with Friends and Family:   . Attends Religious Services:   . Active Member of Clubs or  Organizations:   . Attends Banker Meetings:   Marland Kitchen Marital Status:   Intimate Partner Violence:   . Fear of Current or Ex-Partner:   . Emotionally Abused:   Marland Kitchen Physically Abused:   . Sexually Abused:      PHYSICAL EXAM:  VS: BP (!) 136/80   Pulse 91   Ht 5\' 8"  (1.727 m)   Wt (!) 215 lb (97.5 kg)   BMI 32.69 kg/m  Physical Exam Gen: NAD, alert, cooperative with exam, well-appearing MSK:  Left knee: Effusion. Normal range of motion. No instability with valgus testing. Baker's cyst apparent. Neurovascularly intact  Limited ultrasound: Left knee:  Mild to moderate effusion. Normal-appearing quadricep and patellar tendon. Normal-appearing medial joint space. Increased hyperemia at the origin of the MCL. Baker's cyst apparent.   Summary: Ongoing effusion and inflammatory changes of the MCL.  Ultrasound and interpretation by , MD    ASSESSMENT & PLAN:   Knee effusion, left Effusion and Baker's cyst still present.  Has some crampiness in the gastrocnemius. -Counseled on home exercise therapy and supportive care. -Could consider aspiration injection. -Could consider physical therapy or MRI.   Sprain of medial collateral ligament of left knee Hyperemia still  present at the origin of the MCL.  His pain has improved. -Counseled on home exercise therapy and supportive care. -Can continue the hinged knee brace. -Could consider nitro patches or physical therapy.

## 2020-01-27 NOTE — Patient Instructions (Signed)
Good to see you Please try compression under the brace. You can try an ace wrap or body helix  Please continue ice and elevation   Please send me a message in MyChart with any questions or updates.  Please see me back in 6-8 weeks.   --Dr. Jordan Likes

## 2020-01-27 NOTE — Assessment & Plan Note (Signed)
Effusion and Baker's cyst still present.  Has some crampiness in the gastrocnemius. -Counseled on home exercise therapy and supportive care. -Could consider aspiration injection. -Could consider physical therapy or MRI.

## 2020-03-12 ENCOUNTER — Ambulatory Visit: Payer: PRIVATE HEALTH INSURANCE | Admitting: Family Medicine

## 2020-03-15 ENCOUNTER — Encounter: Payer: Self-pay | Admitting: Family Medicine

## 2020-03-15 ENCOUNTER — Ambulatory Visit: Payer: Self-pay

## 2020-03-15 ENCOUNTER — Other Ambulatory Visit: Payer: Self-pay

## 2020-03-15 ENCOUNTER — Ambulatory Visit (INDEPENDENT_AMBULATORY_CARE_PROVIDER_SITE_OTHER): Payer: PRIVATE HEALTH INSURANCE | Admitting: Family Medicine

## 2020-03-15 VITALS — BP 135/89 | HR 69 | Ht 69.0 in | Wt 214.0 lb

## 2020-03-15 DIAGNOSIS — M25462 Effusion, left knee: Secondary | ICD-10-CM

## 2020-03-15 NOTE — Progress Notes (Signed)
Jared Gates - 39 y.o. male MRN [id number]  Date of birth: Feb 17, 1981  SUBJECTIVE:  Including CC & ROS.  Chief Complaint  Patient presents with  . Follow-up    left knee    Jared Gates is a 39 y.o. male that is presenting with acute worsening of the left knee pain.  His effusion has gotten severe.  He has trouble bending his knee.  The pain is intermittent in nature.  Is localized to the knee.   Review of Systems See HPI   HISTORY: Past Medical, Surgical, Social, and Family History Reviewed & Updated per EMR.   Pertinent Historical Findings include:  Past Medical History:  Diagnosis Date  . Hypertension     No past surgical history on file.  No family history on file.  Social History   Socioeconomic History  . Marital status: Married    Spouse name: Not on file  . Number of children: Not on file  . Years of education: Not on file  . Highest education level: Not on file  Occupational History  . Not on file  Tobacco Use  . Smoking status: Never Smoker  . Smokeless tobacco: Never Used  Substance and Sexual Activity  . Alcohol use: No  . Drug use: No  . Sexual activity: Yes    Birth control/protection: Condom  Other Topics Concern  . Not on file  Social History Narrative  . Not on file   Social Determinants of Health   Financial Resource Strain:   . Difficulty of Paying Living Expenses: Not on file  Food Insecurity:   . Worried About Programme researcher, broadcasting/film/video in the Last Year: Not on file  . Ran Out of Food in the Last Year: Not on file  Transportation Needs:   . Lack of Transportation (Medical): Not on file  . Lack of Transportation (Non-Medical): Not on file  Physical Activity:   . Days of Exercise per Week: Not on file  . Minutes of Exercise per Session: Not on file  Stress:   . Feeling of Stress : Not on file  Social Connections:   . Frequency of Communication with Friends and Family: Not on file  . Frequency of Social Gatherings with Friends  and Family: Not on file  . Attends Religious Services: Not on file  . Active Member of Clubs or Organizations: Not on file  . Attends Banker Meetings: Not on file  . Marital Status: Not on file  Intimate Partner Violence:   . Fear of Current or Ex-Partner: Not on file  . Emotionally Abused: Not on file  . Physically Abused: Not on file  . Sexually Abused: Not on file     PHYSICAL EXAM:  VS: BP 135/89   Pulse 69   Ht 5\' 9"  (1.753 m)   Wt 214 lb (97.1 kg)   BMI 31.60 kg/m  Physical Exam Gen: NAD, alert, cooperative with exam, well-appearing MSK:  Left knee: Effusion present. Limited flexion and extension. No instability with valgus or varus stress testing. No tenderness palpation of the medial or lateral joint space. Neurovascularly intact   Aspiration/Injection Procedure Note Jared Gates [birth year]  Procedure: Aspiration Indications: Left knee pain  Procedure Details Consent: Risks of procedure as well as the alternatives and risks of each were explained to the (patient/caregiver).  Consent for procedure obtained. Time Out: Verified patient identification, verified procedure, site/side was marked, verified correct patient position, special equipment/implants available, medications/allergies/relevent history reviewed, required imaging and test results  available.  Performed.  The area was cleaned with iodine and alcohol swabs.    The left knee superior lateral suprapatellar space was injected using 4cc's of 1% lidocaine with a 25 1 1/2" needle.  An 18-gauge 1-1/2 inch needle was inserted to achieve aspiration.  Ultrasound was used. Images were obtained in long views showing the injection.    Amount of Fluid Aspirated: 20mL Character of Fluid: clear and straw colored Fluid was sent for:n/a  A sterile dressing was applied.  Patient did tolerate procedure well.     ASSESSMENT & PLAN:   Knee effusion, left His effusion has returned with normal  synovial fluid.  He reports feeling better when he does have a large effusion.  Likely meniscal in origin to the source of his effusion. -Counseled on home exercise therapy and supportive care. -Aspiration. -Could consider MRI or physical therapy.

## 2020-03-15 NOTE — Patient Instructions (Signed)
Good to see you Please try ice as needed  Please use the brace if it helps   Please send me a message in MyChart with any questions or updates.  Please see [place] back as needed.   --Dr. Jordan Likes

## 2020-03-16 NOTE — Assessment & Plan Note (Signed)
His effusion has returned with normal synovial fluid.  He reports feeling better when he does have a large effusion.  Likely meniscal in origin to the source of his effusion. -Counseled on home exercise therapy and supportive care. -Aspiration. -Could consider MRI or physical therapy.

## 2020-05-06 ENCOUNTER — Ambulatory Visit (INDEPENDENT_AMBULATORY_CARE_PROVIDER_SITE_OTHER): Payer: PRIVATE HEALTH INSURANCE | Admitting: Family Medicine

## 2020-05-06 ENCOUNTER — Ambulatory Visit: Payer: Self-pay

## 2020-05-06 ENCOUNTER — Other Ambulatory Visit: Payer: Self-pay

## 2020-05-06 ENCOUNTER — Encounter: Payer: Self-pay | Admitting: Family Medicine

## 2020-05-06 VITALS — BP 131/85 | HR 73 | Ht 68.0 in | Wt 211.0 lb

## 2020-05-06 DIAGNOSIS — S83412D Sprain of medial collateral ligament of left knee, subsequent encounter: Secondary | ICD-10-CM

## 2020-05-06 DIAGNOSIS — M25462 Effusion, left knee: Secondary | ICD-10-CM

## 2020-05-06 DIAGNOSIS — M7052 Other bursitis of knee, left knee: Secondary | ICD-10-CM

## 2020-05-06 NOTE — Assessment & Plan Note (Signed)
Has ongoing healing of the origin of the MCL. -Counseled on home exercise therapy and supportive care.

## 2020-05-06 NOTE — Assessment & Plan Note (Signed)
Has ongoing effusion.  Likely has some swelling that is leading to the irritation of the infrapatellar fat pad. -Counseled on home exercise therapy and supportive care. -Could consider aspiration. -Could consider further imaging.

## 2020-05-06 NOTE — Progress Notes (Signed)
Jared Gates - 39 y.o. male MRN [id number]  Date of birth: [birth year]  SUBJECTIVE:  Including CC & ROS.  Chief Complaint  Patient presents with  . Follow-up    left knee    [name] is a 39 y.o. male that is presenting with ongoing pain in the left anterior knee.  Is occurring over the patellar tendon.  He feels the swelling has improved but still has issues in this area.   Review of Systems See HPI   HISTORY: Past Medical, Surgical, Social, and Family History Reviewed & Updated per EMR.   Pertinent Historical Findings include:  Past Medical History:  Diagnosis Date  . Hypertension     No past surgical history on file.  No family history on file.  Social History   Socioeconomic History  . Marital status: Married    Spouse name: Not on file  . Number of children: Not on file  . Years of education: Not on file  . Highest education level: Not on file  Occupational History  . Not on file  Tobacco Use  . Smoking status: Never Smoker  . Smokeless tobacco: Never Used  Substance and Sexual Activity  . Alcohol use: No  . Drug use: No  . Sexual activity: Yes    Birth control/protection: Condom  Other Topics Concern  . Not on file  Social History Narrative  . Not on file   Social Determinants of Health   Financial Resource Strain:   . Difficulty of Paying Living Expenses: Not on file  Food Insecurity:   . Worried About Programme researcher, broadcasting/film/video in the Last Year: Not on file  . Ran Out of Food in the Last Year: Not on file  Transportation Needs:   . Lack of Transportation (Medical): Not on file  . Lack of Transportation (Non-Medical): Not on file  Physical Activity:   . Days of Exercise per Week: Not on file  . Minutes of Exercise per Session: Not on file  Stress:   . Feeling of Stress : Not on file  Social Connections:   . Frequency of Communication with Friends and Family: Not on file  . Frequency of Social Gatherings with Friends and Family: Not on  file  . Attends Religious Services: Not on file  . Active Member of Clubs or Organizations: Not on file  . Attends Banker Meetings: Not on file  . Marital Status: Not on file  Intimate Partner Violence:   . Fear of Current or Ex-Partner: Not on file  . Emotionally Abused: Not on file  . Physically Abused: Not on file  . Sexually Abused: Not on file     PHYSICAL EXAM:  VS: BP 131/85   Pulse 73   Ht 5\' 8"  (1.727 m)   Wt 211 lb (95.7 kg)   BMI 32.08 kg/m  Physical Exam Gen: NAD, alert, cooperative with exam, well-appearing MSK:  Left knee: No obvious effusion. Normal range of motion. Tenderness palpation of the medial joint space. Neurovascular intact  Limited ultrasound: Left knee:  Mild to moderate effusion. Normal-appearing patellar tendon. Some hyperemia in the infrapatellar fat pad. Increased hyperemia at the [facility]. Increased hyperemia at the medial tibial plateau.  Summary: Ongoing effusion with healing MCL  Ultrasound and interpretation by [facility], MD    ASSESSMENT & PLAN:   Sprain of medial collateral ligament of left knee Has ongoing healing of the origin of the MCL. -Counseled on home exercise therapy and supportive care.  Knee effusion, left Has ongoing effusion.  Likely has some swelling that is leading to the irritation of the infrapatellar fat pad. -Counseled on home exercise therapy and supportive care. -Could consider aspiration. -Could consider further imaging.

## 2020-08-04 ENCOUNTER — Ambulatory Visit: Payer: Self-pay | Admitting: Family Medicine

## 2020-08-04 NOTE — Progress Notes (Deleted)
  Jared Gates - 40 y.o. male MRN [id number]  Date of birth: 10/06/1980  SUBJECTIVE:  Including CC & ROS.  No chief complaint on file.   Jared Gates is a 40 y.o. male that is  ***.  ***   Review of Systems See HPI   HISTORY: Past Medical, Surgical, Social, and Family History Reviewed & Updated per EMR.   Pertinent Historical Findings include:  Past Medical History:  Diagnosis Date  . Hypertension     No past surgical history on file.  No family history on file.  Social History   Socioeconomic History  . Marital status: Married    Spouse name: Not on file  . Number of children: Not on file  . Years of education: Not on file  . Highest education level: Not on file  Occupational History  . Not on file  Tobacco Use  . Smoking status: Never Smoker  . Smokeless tobacco: Never Used  Substance and Sexual Activity  . Alcohol use: No  . Drug use: No  . Sexual activity: Yes    Birth control/protection: Condom  Other Topics Concern  . Not on file  Social History Narrative  . Not on file   Social Determinants of Health   Financial Resource Strain: Not on file  Food Insecurity: Not on file  Transportation Needs: Not on file  Physical Activity: Not on file  Stress: Not on file  Social Connections: Not on file  Intimate Partner Violence: Not on file     PHYSICAL EXAM:  VS: There were no vitals taken for this visit. Physical Exam Gen: NAD, alert, cooperative with exam, well-appearing MSK:  ***      ASSESSMENT & PLAN:   No problem-specific Assessment & Plan notes found for this encounter.

## 2020-08-09 ENCOUNTER — Other Ambulatory Visit: Payer: Self-pay

## 2020-08-09 ENCOUNTER — Ambulatory Visit (HOSPITAL_BASED_OUTPATIENT_CLINIC_OR_DEPARTMENT_OTHER)
Admission: RE | Admit: 2020-08-09 | Discharge: 2020-08-09 | Disposition: A | Discharge status: Home / Self Care | Payer: 59 | Source: Ambulatory Visit | Attending: Family Medicine | Admitting: Family Medicine

## 2020-08-09 ENCOUNTER — Ambulatory Visit (INDEPENDENT_AMBULATORY_CARE_PROVIDER_SITE_OTHER): Payer: 59 | Admitting: Family Medicine

## 2020-08-09 VITALS — BP 110/78 | Ht 68.0 in | Wt 211.0 lb

## 2020-08-09 DIAGNOSIS — M25462 Effusion, left knee: Secondary | ICD-10-CM | POA: Diagnosis not present

## 2020-08-09 NOTE — Addendum Note (Signed)
Addended by: Merrilyn Puma on: 08/09/2020 12:25 PM   Modules accepted: Orders

## 2020-08-09 NOTE — Progress Notes (Signed)
  Jared Gates - 40 y.o. male MRN [id number]  Date of birth: [birth year]  SUBJECTIVE:  Including CC & ROS.  No chief complaint on file.   Jared Gates is a 40 y.o. male that is presenting with acute on chronic left knee pain.  Initially had his injury last July.  Has had knee aspirations repeatedly.  Continues to feel unstable in the knee.  Has tried medications and bracing and home therapy.  No previous history of injury or surgery.   Review of Systems See HPI   HISTORY: Past Medical, Surgical, Social, and Family History Reviewed & Updated per EMR.   Pertinent Historical Findings include:  Past Medical History:  Diagnosis Date  . Hypertension     No past surgical history on file.  No family history on file.  Social History   Socioeconomic History  . Marital status: Married    Spouse name: Not on file  . Number of children: Not on file  . Years of education: Not on file  . Highest education level: Not on file  Occupational History  . Not on file  Tobacco Use  . Smoking status: Never Smoker  . Smokeless tobacco: Never Used  Substance and Sexual Activity  . Alcohol use: No  . Drug use: No  . Sexual activity: Yes    Birth control/protection: Condom  Other Topics Concern  . Not on file  Social History Narrative  . Not on file   Social Determinants of Health   Financial Resource Strain: Not on file  Food Insecurity: Not on file  Transportation Needs: Not on file  Physical Activity: Not on file  Stress: Not on file  Social Connections: Not on file  Intimate Partner Violence: Not on file     PHYSICAL EXAM:  VS: BP 110/78 (BP Location: Right Arm, Patient Position: Sitting, Cuff Size: Large)   Ht 5\' 8"  (1.727 m)   Wt 211 lb (95.7 kg)   BMI 32.08 kg/m  Physical Exam Gen: NAD, alert, cooperative with exam, well-appearing MSK:  Left knee: Mild effusion. Translation with anterior drawer. Instability with valgus stress testing. Normal strength  resistance. Neurovascular intact     ASSESSMENT & PLAN:   Knee effusion, left Acute on chronic in nature.  Injury occurred last July.  Has had repeated aspirations.  Concern of internal derangement given laxity of knee and ongoing issues from his initial injury. -Counseled on home exercise therapy and supportive care. -X-ray. -MRI of left knee to evaluate for internal derangement.

## 2020-08-09 NOTE — Patient Instructions (Signed)
Good to see you Please call [phone] I will call with the xray results.   Please send me a message in MyChart with any questions or updates.  We will setup a virtual visit once the MRI is resulted.   --Dr. Jordan Likes

## 2020-08-09 NOTE — Assessment & Plan Note (Signed)
Acute on chronic in nature.  Injury occurred last July.  Has had repeated aspirations.  Concern of internal derangement given laxity of knee and ongoing issues from his initial injury. -Counseled on home exercise therapy and supportive care. -X-ray. -MRI of left knee to evaluate for internal derangement.

## 2020-08-10 ENCOUNTER — Telehealth: Payer: Self-pay | Admitting: Family Medicine

## 2020-08-10 NOTE — Telephone Encounter (Signed)
Informed of results.   Myra Rude, MD [facility] 08/10/2020, 9:59 AM

## 2020-08-14 ENCOUNTER — Ambulatory Visit (HOSPITAL_BASED_OUTPATIENT_CLINIC_OR_DEPARTMENT_OTHER): Admission: RE | Admit: 2020-08-14 | Payer: 59 | Source: Ambulatory Visit

## 2020-08-21 ENCOUNTER — Ambulatory Visit (HOSPITAL_BASED_OUTPATIENT_CLINIC_OR_DEPARTMENT_OTHER): Admission: RE | Admit: 2020-08-21 | Payer: 59 | Source: Ambulatory Visit

## 2021-01-24 ENCOUNTER — Other Ambulatory Visit: Payer: Self-pay

## 2021-01-24 ENCOUNTER — Encounter: Payer: Self-pay | Admitting: Family Medicine

## 2021-01-24 ENCOUNTER — Ambulatory Visit: Payer: Self-pay | Admitting: Family Medicine

## 2021-01-24 DIAGNOSIS — Z113 Encounter for screening for infections with a predominantly sexual mode of transmission: Secondary | ICD-10-CM

## 2021-01-24 DIAGNOSIS — N341 Nonspecific urethritis: Secondary | ICD-10-CM

## 2021-01-24 LAB — GRAM STAIN

## 2021-01-24 MED ORDER — DOXYCYCLINE HYCLATE 100 MG PO TABS: 100.0000 mg | ORAL_TABLET | Freq: Two times a day (BID) | ORAL | 0 refills | Status: AC

## 2021-01-24 NOTE — Progress Notes (Signed)
Pt here for STD screening.  Gram stain results reviewed, medication dispensed per SO. Berdie Ogren, RN

## 2021-01-24 NOTE — Progress Notes (Signed)
   [facility] Department STI clinic/screening visit  Subjective:  Jared Gates is a 40 y.o. male being seen today for an STI screening visit. The patient reports they do have symptoms.    Patient has the following medical conditions:   Patient Active Problem List   Diagnosis Date Noted   Sprain of medial collateral ligament of left knee 12/31/2019   Knee effusion, left 12/26/2019   Atopic dermatitis 06/05/2019     Chief Complaint  Patient presents with   SEXUALLY TRANSMITTED DISEASE    Screening     HPI  Patient reports here for screening, reports having symptoms x ~3 days.     See flowsheet for further details and programmatic requirements.    The following portions of the patient's history were reviewed and updated as appropriate: allergies, current medications, past medical history, past social history, past surgical history and problem list.  Objective:  There were no vitals filed for this visit.  Physical Exam Constitutional:      Appearance: Normal appearance.  HENT:     Head: Normocephalic.     Mouth/Throat:     Mouth: Mucous membranes are moist.     Pharynx: Oropharynx is clear. No oropharyngeal exudate.  Pulmonary:     Effort: Pulmonary effort is normal.  Genitourinary:    Penis: Normal.      Testes: Normal.     Comments: No lice, nits, or pest, no lesions or odor discharge.  Denies pain or tenderness with paplation of testicles.  No lesions, ulcers or masses present.    Musculoskeletal:     Cervical back: Normal range of motion and neck supple.  Lymphadenopathy:     Cervical: No cervical adenopathy.  Skin:    General: Skin is warm and dry.     Findings: No bruising, erythema, lesion or rash.  Neurological:     Mental Status: He is alert and oriented to person, place, and time.  Psychiatric:        Mood and Affect: Mood normal.        Behavior: Behavior normal.      Assessment and Plan:  Jared Gates is a 40 y.o. male  presenting to the [facility] Department for STI screening  1. Screening examination for venereal disease Patient does have STI symptoms Patient accepted all screenings including  gram stain, urethral GC and declines bloodwork for HIV/RPR.  Patient meets criteria for HepB screening? No. Ordered? No - does not meet criteria  Patient meets criteria for HepC screening? No. Ordered? No - does not meet criteria  Recommended condom use with all sex Discussed importance of condom use for STI prevent   2. NGU (nongonococcal urethritis)  Treat gram stain per standing order for NGU  Discussed time line for [facility] results and that patient will be called with positive results and encouraged patient to call if he had not heard in 2 weeks Recommended returning for continued or worsening symptoms.   - Gonococcus culture - Gram stain   Return for as needed.  No future appointments.  Wendi Snipes, FNP

## 2021-01-24 NOTE — Addendum Note (Signed)
Addended by: Berdie Ogren on: 01/24/2021 04:27 PM   Modules accepted: Orders

## 2021-01-28 LAB — GONOCOCCUS CULTURE

## 2021-06-17 ENCOUNTER — Encounter: Payer: Self-pay | Admitting: Family Medicine

## 2021-06-17 ENCOUNTER — Ambulatory Visit (INDEPENDENT_AMBULATORY_CARE_PROVIDER_SITE_OTHER): Payer: Self-pay | Admitting: Family Medicine

## 2021-06-17 ENCOUNTER — Ambulatory Visit: Payer: Self-pay

## 2021-06-17 VITALS — BP 140/80 | Ht 68.0 in | Wt 211.0 lb

## 2021-06-17 DIAGNOSIS — M25461 Effusion, right knee: Secondary | ICD-10-CM

## 2021-06-17 NOTE — Patient Instructions (Signed)
Good to see you Please use ice as needed  Please try the exercises   Please send me a message in MyChart with any questions or updates.  Please see me back in 4 weeks or as needed if better.   --Dr. Ronnisha Felber  

## 2021-06-17 NOTE — Assessment & Plan Note (Signed)
Acutely occurring on the right knee.  Having changes over the medial compartment of the indicates medial meniscus in origin. -Counseled on home exercise therapy and supportive care. -Aspiration -Could consider physical therapy or further imaging.

## 2021-06-17 NOTE — Progress Notes (Signed)
°  Jared Gates - 40 y.o. male MRN [id number]  Date of birth: [birth year]  SUBJECTIVE:  Including CC & ROS.  No chief complaint on file.   Jared Gates is a 40 y.o. male that is presenting with acute right knee swelling.  He had a twisting motion and had pain over the medial aspect.  Since that time the swelling has been ongoing.   Review of Systems See HPI   HISTORY: Past Medical, Surgical, Social, and Family History Reviewed & Updated per EMR.   Pertinent Historical Findings include:  Past Medical History:  Diagnosis Date   Hypertension     History reviewed. No pertinent surgical history.  History reviewed. No pertinent family history.  Social History   Socioeconomic History   Marital status: Married    Spouse name: Not on file   Number of children: Not on file   Years of education: Not on file   Highest education level: Not on file  Occupational History   Not on file  Tobacco Use   Smoking status: Never   Smokeless tobacco: Never  Vaping Use   Vaping Use: Never used  Substance and Sexual Activity   Alcohol use: No   Drug use: No   Sexual activity: Yes  Other Topics Concern   Not on file  Social History Narrative   Not on file   Social Determinants of Health   Financial Resource Strain: Not on file  Food Insecurity: Not on file  Transportation Needs: Not on file  Physical Activity: Not on file  Stress: Not on file  Social Connections: Not on file  Intimate Partner Violence: Not At Risk   Fear of Current or Ex-Partner: No   Emotionally Abused: No   Physically Abused: No   Sexually Abused: No     PHYSICAL EXAM:  VS: BP 140/80 (BP Location: Left Arm, Patient Position: Sitting)    Ht 5\' 8"  (1.727 m)    Wt 211 lb (95.7 kg)    BMI 32.08 kg/m  Physical Exam Gen: NAD, alert, cooperative with exam, well-appearing    Aspiration/Injection Procedure Note Demoni Gergen 07-Oct-1980  Procedure: Aspiration Indications: Right knee pain  Procedure  Details Consent: Risks of procedure as well as the alternatives and risks of each were explained to the (patient/caregiver).  Consent for procedure obtained. Time Out: Verified patient identification, verified procedure, site/side was marked, verified correct patient position, special equipment/implants available, medications/allergies/relevent history reviewed, required imaging and test results available.  Performed.  The area was cleaned with iodine and alcohol swabs.    The right knee superior lateral suprapatellar pouch was injected using 3 cc's of 1% lidocaine with a 25 1 1/2" needle.  An 18-gauge 1-1/2 inch needle was used to achieve aspiration.  Ultrasound was used. Images were obtained in long views showing the injection.    Amount of Fluid Aspirated:  32mL Character of Fluid: clear and straw colored Fluid was sent for: n/a  A sterile dressing was applied.  Patient did tolerate procedure well.     ASSESSMENT & PLAN:   Knee effusion, right Acutely occurring on the right knee.  Having changes over the medial compartment of the indicates medial meniscus in origin. -Counseled on home exercise therapy and supportive care. -Aspiration -Could consider physical therapy or further imaging.

## 2021-07-21 ENCOUNTER — Other Ambulatory Visit: Payer: Self-pay

## 2021-07-21 ENCOUNTER — Ambulatory Visit
Admission: EM | Admit: 2021-07-21 | Discharge: 2021-07-21 | Disposition: A | Discharge status: Home / Self Care | Payer: Self-pay | Attending: Internal Medicine | Admitting: Internal Medicine

## 2021-07-21 ENCOUNTER — Encounter: Payer: Self-pay | Admitting: Emergency Medicine

## 2021-07-21 DIAGNOSIS — Z20818 Contact with and (suspected) exposure to other bacterial communicable diseases: Secondary | ICD-10-CM | POA: Insufficient documentation

## 2021-07-21 DIAGNOSIS — J039 Acute tonsillitis, unspecified: Secondary | ICD-10-CM | POA: Insufficient documentation

## 2021-07-21 DIAGNOSIS — Z20822 Contact with and (suspected) exposure to covid-19: Secondary | ICD-10-CM | POA: Insufficient documentation

## 2021-07-21 DIAGNOSIS — R197 Diarrhea, unspecified: Secondary | ICD-10-CM | POA: Insufficient documentation

## 2021-07-21 LAB — POCT RAPID STREP A (OFFICE): Rapid Strep A Screen: NEGATIVE

## 2021-07-21 MED ORDER — AMOXICILLIN 500 MG PO CAPS: 500.0000 mg | ORAL_CAPSULE | Freq: Two times a day (BID) | ORAL | 0 refills | Status: AC

## 2021-07-21 NOTE — Discharge Instructions (Signed)
Your rapid strep test was negative but still suspicious of strep throat given appearance of your throat on exam.  You are being treated with amoxicillin antibiotic.  Throat culture, COVID-19, flu test are also pending.  We will call if they are positive.

## 2021-07-21 NOTE — ED Provider Notes (Signed)
[facility]    CSN: [id number] Arrival date & time: 07/21/21  0801      History   Chief Complaint Chief Complaint  Patient presents with   Fever    HPI Jared Gates is a 41 y.o. male.   Patient presents with fever, body aches, diarrhea that started approximately 5 days ago.  Patient has had a total of 3 episodes of diarrhea with no blood in stool.  Denies nausea, vomiting, abdominal pain.  Patient not sure of T-max at home but states that he "had a fever in his stomach".  Denies any upper respiratory symptoms including nasal congestion, cough, sore throat, chest pain, shortness of breath.  Patient reports that he has been exposed to strep throat.  Has been able to eat and drink fluids.  Patient has not taken any medications to help alleviate symptoms.   Fever  Past Medical History:  Diagnosis Date   Hypertension     Patient Active Problem List   Diagnosis Date Noted   Knee effusion, right 06/17/2021   Sprain of medial collateral ligament of left knee 12/31/2019   Knee effusion, left 12/26/2019   Atopic dermatitis 06/05/2019    History reviewed. No pertinent surgical history.     Home Medications    Prior to Admission medications   Medication Sig Start Date End Date Taking? Authorizing Provider  amoxicillin (AMOXIL) 500 MG capsule Take 1 capsule (500 mg total) by mouth 2 (two) times daily for 10 days. 07/21/21 07/31/21 Yes Maclaine Ahola, Acie FredricksonHaley E, FNP  predniSONE (DELTASONE) 5 MG tablet Take 6 pills for first day, 5 pills second day, 4 pills third day, 3 pills fourth day, 2 pills the fifth day, and 1 pill sixth day. 12/25/19   Myra RudeSchmitz, Jeremy E, MD    Family History History reviewed. No pertinent family history.  Social History Social History   Tobacco Use   Smoking status: Never   Smokeless tobacco: Never  Vaping Use   Vaping Use: Never used  Substance Use Topics   Alcohol use: No   Drug use: No     Allergies   Patient has no known  allergies.   Review of Systems Review of Systems Per HPI  Physical Exam Triage Vital Signs ED Triage Vitals  Enc Vitals Group     BP 07/21/21 0816 139/86     Pulse Rate 07/21/21 0816 (!) 56     Resp 07/21/21 0816 16     Temp 07/21/21 0816 97.6 F (36.4 C)     Temp Source 07/21/21 0816 Oral     SpO2 07/21/21 0816 96 %     Weight --      Height --      Head Circumference --      Peak Flow --      Pain Score 07/21/21 0817 2     Pain Loc --      Pain Edu? --      Excl. in GC? --    No data found.  Updated Vital Signs BP 139/86 (BP Location: Left Arm)    Pulse (!) 56    Temp 97.6 F (36.4 C) (Oral)    Resp 16    SpO2 96%   Visual Acuity Right Eye Distance:   Left Eye Distance:   Bilateral Distance:    Right Eye Near:   Left Eye Near:    Bilateral Near:     Physical Exam Constitutional:      General: He is not  in acute distress.    Appearance: Normal appearance. He is not toxic-appearing or diaphoretic.  HENT:     Head: Normocephalic and atraumatic.     Right Ear: Tympanic membrane and ear canal normal.     Left Ear: Tympanic membrane and ear canal normal.     Nose: Nose normal.     Mouth/Throat:     Mouth: Mucous membranes are moist.     Pharynx: Posterior oropharyngeal erythema present. No pharyngeal swelling, oropharyngeal exudate or uvula swelling.     Tonsils: Tonsillar exudate present. No tonsillar abscesses. 2+ on the right. 2+ on the left.  Eyes:     Extraocular Movements: Extraocular movements intact.     Conjunctiva/sclera: Conjunctivae normal.     Pupils: Pupils are equal, round, and reactive to light.  Cardiovascular:     Rate and Rhythm: Normal rate and regular rhythm.     Pulses: Normal pulses.     Heart sounds: Normal heart sounds.  Pulmonary:     Effort: Pulmonary effort is normal. No respiratory distress.     Breath sounds: Normal breath sounds.  Abdominal:     General: Bowel sounds are normal. There is no distension.     Palpations:  Abdomen is soft.     Tenderness: There is no abdominal tenderness.  Neurological:     General: No focal deficit present.     Mental Status: He is alert and oriented to person, place, and time. Mental status is at baseline.  Psychiatric:        Mood and Affect: Mood normal.        Behavior: Behavior normal.        Thought Content: Thought content normal.        Judgment: Judgment normal.     UC Treatments / Results  Labs (all labs ordered are listed, but only abnormal results are displayed) Labs Reviewed  CULTURE, GROUP A STREP ([facility])  COVID-19, FLU A+B NAA  POCT RAPID STREP A (OFFICE)    EKG   Radiology No results found.  Procedures Procedures (including critical care time)  Medications Ordered in UC Medications - No data to display  Initial Impression / Assessment and Plan / UC Course  I have reviewed the triage vital signs and the nursing notes.  Pertinent labs & imaging results that were available during my care of the patient were reviewed by me and considered in my medical decision making (see chart for details).     Rapid strep test was negative but suspicious of strep throat given appearance of posterior pharynx and acute tonsillitis on exam.  Will opt to treat with amoxicillin antibiotic given patient's exposure and physical exam.  Throat culture is pending.  COVID-19 and flu test pending to rule out viral etiology.  Patient to increase clear oral fluid intake to prevent dehydration associated with diarrhea.  No signs of dehydration on exam currently.  Discussed return precautions.  Patient verbalized understanding and was agreeable with plan. Final Clinical Impressions(s) / UC Diagnoses   Final diagnoses:  Acute tonsillitis, unspecified etiology  Diarrhea, unspecified type  Encounter for laboratory testing for COVID-19 virus  Strep throat exposure     Discharge Instructions      Your rapid strep test was negative but still suspicious of strep throat  given appearance of your throat on exam.  You are being treated with amoxicillin antibiotic.  Throat culture, COVID-19, flu test are also pending.  We will call if they are positive.  ED Prescriptions     Medication Sig Dispense Auth. Provider   amoxicillin (AMOXIL) 500 MG capsule Take 1 capsule (500 mg total) by mouth 2 (two) times daily for 10 days. 20 capsule Gustavus Bryant, [place]      PDMP not reviewed this encounter.   Gustavus Bryant, [place] 07/21/21 [phone]

## 2021-07-21 NOTE — ED Triage Notes (Addendum)
"  I have fever down here (rubs all over abdomin), but when I checked my temperature I didn't have a fever." Reports fatigue, generalized body aches, 3 cases of diarrhea, symptoms starting Sunday. Denies URI symptoms, cough, SOB, CP, palpitations, N/V, urinary abnormalities. Requesting work note.

## 2021-07-22 LAB — COVID-19, FLU A+B NAA
Influenza A, NAA: NOT DETECTED
Influenza B, NAA: NOT DETECTED
SARS-CoV-2, NAA: NOT DETECTED

## 2021-07-23 LAB — CULTURE, GROUP A STREP (THRC)

## 2022-01-10 ENCOUNTER — Ambulatory Visit: Payer: Self-pay

## 2022-08-28 ENCOUNTER — Other Ambulatory Visit: Payer: Self-pay

## 2022-08-28 ENCOUNTER — Emergency Department (HOSPITAL_BASED_OUTPATIENT_CLINIC_OR_DEPARTMENT_OTHER)
Admission: EM | Admit: 2022-08-28 | Discharge: 2022-08-28 | Disposition: A | Discharge status: Home / Self Care | Payer: Commercial Managed Care - HMO | Attending: Emergency Medicine | Admitting: Emergency Medicine

## 2022-08-28 ENCOUNTER — Emergency Department (HOSPITAL_BASED_OUTPATIENT_CLINIC_OR_DEPARTMENT_OTHER): Payer: Commercial Managed Care - HMO

## 2022-08-28 ENCOUNTER — Encounter (HOSPITAL_BASED_OUTPATIENT_CLINIC_OR_DEPARTMENT_OTHER): Payer: Self-pay

## 2022-08-28 DIAGNOSIS — J209 Acute bronchitis, unspecified: Secondary | ICD-10-CM | POA: Diagnosis not present

## 2022-08-28 DIAGNOSIS — Z20822 Contact with and (suspected) exposure to covid-19: Secondary | ICD-10-CM | POA: Diagnosis not present

## 2022-08-28 DIAGNOSIS — R059 Cough, unspecified: Secondary | ICD-10-CM | POA: Diagnosis present

## 2022-08-28 LAB — RESP PANEL BY RT-PCR (RSV, FLU A&B, COVID)  RVPGX2
Influenza A by PCR: NEGATIVE
Influenza B by PCR: NEGATIVE
Resp Syncytial Virus by PCR: NEGATIVE
SARS Coronavirus 2 by RT PCR: NEGATIVE

## 2022-08-28 LAB — CBC WITH DIFFERENTIAL/PLATELET
Abs Immature Granulocytes: 0.01 10*3/uL (ref 0.00–0.07)
Basophils Absolute: 0 10*3/uL (ref 0.0–0.1)
Basophils Relative: 1 %
Eosinophils Absolute: 0.2 10*3/uL (ref 0.0–0.5)
Eosinophils Relative: 3 %
HCT: 41.1 % (ref 39.0–52.0)
Hemoglobin: 14.1 g/dL (ref 13.0–17.0)
Immature Granulocytes: 0 %
Lymphocytes Relative: 39 %
Lymphs Abs: 2.3 10*3/uL (ref 0.7–4.0)
MCH: 30.1 pg (ref 26.0–34.0)
MCHC: 34.3 g/dL (ref 30.0–36.0)
MCV: 87.8 fL (ref 80.0–100.0)
Monocytes Absolute: 0.4 10*3/uL (ref 0.1–1.0)
Monocytes Relative: 7 %
Neutro Abs: 2.9 10*3/uL (ref 1.7–7.7)
Neutrophils Relative %: 50 %
Platelets: 241 10*3/uL (ref 150–400)
RBC: 4.68 MIL/uL (ref 4.22–5.81)
RDW: 12.7 % (ref 11.5–15.5)
WBC: 5.9 10*3/uL (ref 4.0–10.5)
nRBC: 0 % (ref 0.0–0.2)

## 2022-08-28 LAB — BASIC METABOLIC PANEL
Anion gap: 3 — ABNORMAL LOW (ref 5–15)
BUN: 7 mg/dL (ref 6–20)
CO2: 28 mmol/L (ref 22–32)
Calcium: 8.4 mg/dL — ABNORMAL LOW (ref 8.9–10.3)
Chloride: 107 mmol/L (ref 98–111)
Creatinine, Ser: 1.08 mg/dL (ref 0.61–1.24)
GFR, Estimated: >60 mL/min (ref >60)
Glucose, Bld: 96 mg/dL (ref 70–99)
Potassium: 4 mmol/L (ref 3.5–5.1)
Sodium: 138 mmol/L (ref 135–145)

## 2022-08-28 LAB — TROPONIN I (HIGH SENSITIVITY): Troponin I (High Sensitivity): 4 ng/L (ref <18)

## 2022-08-28 MED ORDER — BENZONATATE 100 MG PO CAPS: 100.0000 mg | ORAL_CAPSULE | Freq: Three times a day (TID) | ORAL | 0 refills | Status: AC

## 2022-08-28 MED ORDER — PREDNISONE 10 MG PO TABS: 20.0000 mg | ORAL_TABLET | Freq: Every day | ORAL | 0 refills | Status: AC

## 2022-08-28 MED ORDER — AZITHROMYCIN 250 MG PO TABS: 250.0000 mg | ORAL_TABLET | Freq: Every day | ORAL | 0 refills | Status: DC

## 2022-08-28 NOTE — ED Provider Notes (Signed)
[place] [facility] [place] Provider Note   CSN: [id number] Arrival date & time: 08/28/22  [id number]     History  Chief Complaint  Patient presents with   Nasal Congestion    Jared Gates is a 42 y.o. male.  HPI   42 year old male presenting to the emergency department with URI symptoms.  The patient states that this since this past Thursday he has had nasal congestion which has then developed into a mildly productive cough.  He endorses some chest discomfort, starting on Saturday and intermittently throughout the day since then described as a heaviness in his chest.  He denies any fevers or chills.  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  He states that his nasal congestion has improved but now feels congestion "settling in my chest."  Home Medications Prior to Admission medications   Medication Sig Start Date End Date Taking? Authorizing Provider  azithromycin (ZITHROMAX) 250 MG tablet Take 1 tablet (250 mg total) by mouth daily. Take first 2 tablets together, then 1 every day until finished. 08/28/22  Yes Regan Lemming, MD  benzonatate (TESSALON) 100 MG capsule Take 1 capsule (100 mg total) by mouth every 8 (eight) hours. 08/28/22  Yes Regan Lemming, MD  predniSONE (DELTASONE) 10 MG tablet Take 2 tablets (20 mg total) by mouth daily. 08/28/22  Yes Regan Lemming, MD      Allergies    Patient has no known allergies.    Review of Systems   Review of Systems  All other systems reviewed and are negative.   Physical Exam Updated Vital Signs BP 124/80   Pulse 61   Temp 98.1 F (36.7 C) (Oral)   Resp 14   Ht '5\' 8"'$  (1.727 m)   Wt 95.3 kg   SpO2 96%   BMI 31.93 kg/m  Physical Exam Vitals and nursing note reviewed.  Constitutional:      General: He is not in acute distress.    Appearance: He is well-developed.  HENT:     Head: Normocephalic and atraumatic.  Eyes:     Conjunctiva/sclera: Conjunctivae normal.   Cardiovascular:     Rate and Rhythm: Normal rate and regular rhythm.  Pulmonary:     Effort: Pulmonary effort is normal. No respiratory distress.     Breath sounds: Normal breath sounds.  Abdominal:     Palpations: Abdomen is soft.     Tenderness: There is no abdominal tenderness.  Musculoskeletal:        General: No swelling.     Cervical back: Neck supple.  Skin:    General: Skin is warm and dry.     Capillary Refill: Capillary refill takes less than 2 seconds.  Neurological:     Mental Status: He is alert.  Psychiatric:        Mood and Affect: Mood normal.     ED Results / Procedures / Treatments   Labs (all labs ordered are listed, but only abnormal results are displayed) Labs Reviewed  BASIC METABOLIC PANEL - Abnormal; Notable for the following components:      Result Value   Calcium 8.4 (*)    Anion gap 3 (*)    All other components within normal limits  RESP PANEL BY RT-PCR (RSV, FLU A&B, COVID)  [id number]  CBC WITH DIFFERENTIAL/PLATELET  TROPONIN I (HIGH SENSITIVITY)    EKG EKG Interpretation  Date/Time:  Monday August 28 2022 08:23:18 EST Ventricular Rate:  65 PR Interval:  195 QRS Duration: 92 QT Interval:  384 QTC Calculation: 400 R Axis:   50 Text Interpretation: Sinus rhythm Confirmed by Regan Lemming ([id number]) on 08/28/2022 8:50:06 AM  Radiology DG Chest 2 View  Result Date: 08/28/2022 CLINICAL DATA:  Cough. EXAM: CHEST - 2 VIEW COMPARISON:  October 26, 2009. FINDINGS: The heart size and mediastinal contours are within normal limits. Both lungs are clear. The visualized skeletal structures are unremarkable. IMPRESSION: No active cardiopulmonary disease. Electronically Signed   By: Marijo Conception M.D.   On: 08/28/2022 09:14    Procedures Procedures    Medications Ordered in ED Medications - No data to display  ED Course/ Medical Decision Making/ A&P                             Medical Decision Making Amount and/or Complexity of Data  Reviewed Labs: ordered. Radiology: ordered.  Risk Prescription drug management.     43 year old male presenting to the emergency department with URI symptoms.  The patient states that this since this past Thursday he has had nasal congestion which has then developed into a mildly productive cough.  He endorses some chest discomfort, starting on Saturday and intermittently throughout the day since then described as a heaviness in his chest.  He denies any fevers or chills.  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  He states that his nasal congestion has improved but now feels congestion "settling in my chest."  On arrival, the patient was afebrile, not tachycardic or tachypneic, saturating well on room air.  Physical exam generally unremarkable.  Differential diagnosis includes viral URI, COVID-19, influenza PCR testing, developing bacterial pneumonia.  Low concern for ACS, low concern for PE, patient PERC negative.  Initial EKG revealed sinus rhythm, ventricular rate 6 5, no acute ischemic changes and no abnormal intervals.  A chest x-ray revealed no active cardiopulmonary disease with no pneumothorax or pneumonia.  Initial laboratory evaluation significant for COVID-19, influenza, RSV PCR testing negative, troponin 4, BMP unremarkable with mild hypocalcemia at 8.4, otherwise unremarkable, CBC with no leukocytosis or anemia.  Patient symptoms are consistent with likely bronchitis, possibly developing pneumonia.  Will treat with a course of Tessalon, prednisone, azithromycin and have the patient follow-up outpatient.  Return precautions provided.   Final Clinical Impression(s) / ED Diagnoses Final diagnoses:  Acute bronchitis, unspecified organism    Rx / DC Orders ED Discharge Orders          Ordered    azithromycin (ZITHROMAX) 250 MG tablet  Daily        08/28/22 1003    predniSONE (DELTASONE) 10 MG tablet  Daily        08/28/22 1003    benzonatate (TESSALON) 100  MG capsule  Every 8 hours        08/28/22 1003              Regan Lemming, MD 08/28/22 1009

## 2022-08-28 NOTE — ED Triage Notes (Signed)
Pt reports to ER with complaints of having some sore throat, cough, congestion and some chest pain from coughing x 1 week. States that the congestion has settled in his chest. Denies pain but states that he does have some heaviness in his chest this morning.

## 2022-08-28 NOTE — Discharge Instructions (Addendum)
Your symptoms are consistent with acute bronchitis versus developing bacterial pneumonia.  Your laboratory evaluation, EKG and chest x-ray were reassuring.  Will treat with a course of antibiotics, steroids and Tessalon for cough suppressant.

## 2022-09-07 ENCOUNTER — Institutional Professional Consult (permissible substitution): Payer: Commercial Managed Care - HMO | Admitting: Adult Health

## 2022-10-03 ENCOUNTER — Institutional Professional Consult (permissible substitution): Payer: Commercial Managed Care - HMO | Admitting: Adult Health

## 2022-10-16 ENCOUNTER — Encounter: Payer: Self-pay | Admitting: *Deleted

## 2023-06-18 ENCOUNTER — Ambulatory Visit: Payer: Self-pay

## 2023-07-11 ENCOUNTER — Ambulatory Visit
Admission: EM | Admit: 2023-07-11 | Discharge: 2023-07-11 | Disposition: A | Discharge status: Home / Self Care | Payer: Self-pay | Attending: Family Medicine | Admitting: Family Medicine

## 2023-07-11 ENCOUNTER — Ambulatory Visit (INDEPENDENT_AMBULATORY_CARE_PROVIDER_SITE_OTHER): Payer: Self-pay | Admitting: Radiology

## 2023-07-11 ENCOUNTER — Other Ambulatory Visit: Payer: Self-pay | Admitting: Radiology

## 2023-07-11 ENCOUNTER — Encounter: Payer: Self-pay | Admitting: Emergency Medicine

## 2023-07-11 ENCOUNTER — Telehealth: Payer: Self-pay | Admitting: Physician Assistant

## 2023-07-11 DIAGNOSIS — R059 Cough, unspecified: Secondary | ICD-10-CM

## 2023-07-11 DIAGNOSIS — J22 Unspecified acute lower respiratory infection: Secondary | ICD-10-CM

## 2023-07-11 LAB — POCT INFLUENZA A/B
Influenza A, POC: NEGATIVE
Influenza B, POC: NEGATIVE

## 2023-07-11 MED ORDER — AZITHROMYCIN 250 MG PO TABS: 250.0000 mg | ORAL_TABLET | Freq: Every day | ORAL | 0 refills | Status: AC

## 2023-07-11 MED ORDER — PROMETHAZINE-DM 6.25-15 MG/5ML PO SYRP: 5.0000 mL | ORAL_SOLUTION | Freq: Four times a day (QID) | ORAL | 0 refills | Status: AC | PRN

## 2023-07-11 MED ORDER — ALBUTEROL SULFATE HFA 108 (90 BASE) MCG/ACT IN AERS: 2.0000 | INHALATION_SPRAY | Freq: Four times a day (QID) | RESPIRATORY_TRACT | 0 refills | Status: AC | PRN

## 2023-07-11 NOTE — ED Triage Notes (Addendum)
 Pt c/o cough, congestion, fatigue, sob, and fever that began 4 weeks ago as just cough and congestion but has gotten worse in last few days.  Took ibuprofen around 8 today.

## 2023-07-11 NOTE — Telephone Encounter (Signed)
 Official radiologic report for chest x-ray reviewed, no change in therapy needed

## 2023-07-11 NOTE — ED Provider Notes (Signed)
 [facility]    CSN: [id number] Arrival date & time: 07/11/23  9085      History   Chief Complaint Chief Complaint  Patient presents with   Cough   Shortness of Breath    HPI Bill Yohn is a 43 y.o. male.   The history is provided by the patient.  Cough Associated symptoms: fever and shortness of breath   Associated symptoms: no headaches, no rash, no rhinorrhea, no sore throat and no wheezing   Shortness of Breath Associated symptoms: cough and fever   Associated symptoms: no headaches, no rash, no sore throat, no vomiting and no wheezing   Has had a cough for approximately 4 weeks feels like is gotten worse over the last 2 days now having subjective fever, chest heaviness and chest breath.  Not having rhinorrhea, nasal congestion, earache, headache or bodyaches.  Household members have had cough but they improved.  Denies confirmed COVID or flu exposures.  Past Medical History:  Diagnosis Date   Hypertension     Patient Active Problem List   Diagnosis Date Noted   Knee effusion, right 06/17/2021   Sprain of medial collateral ligament of left knee 12/31/2019   Knee effusion, left 12/26/2019   Atopic dermatitis 06/05/2019    History reviewed. No pertinent surgical history.     Home Medications    Prior to Admission medications   Medication Sig Start Date End Date Taking? Authorizing Provider  azithromycin  (ZITHROMAX ) 250 MG tablet Take 1 tablet (250 mg total) by mouth daily. Take first 2 tablets together, then 1 every day until finished. Patient not taking: Reported on 07/11/2023 08/28/22   Jerrol Agent, MD  benzonatate  (TESSALON ) 100 MG capsule Take 1 capsule (100 mg total) by mouth every 8 (eight) hours. Patient not taking: Reported on 07/11/2023 08/28/22   Jerrol Agent, MD  predniSONE  (DELTASONE ) 10 MG tablet Take 2 tablets (20 mg total) by mouth daily. Patient not taking: Reported on 07/11/2023 08/28/22   Jerrol Agent, MD    Family  History History reviewed. No pertinent family history.  Social History Social History   Tobacco Use   Smoking status: Never   Smokeless tobacco: Never  Vaping Use   Vaping status: Never Used  Substance Use Topics   Alcohol use: No   Drug use: No     Allergies   Patient has no known allergies.   Review of Systems Review of Systems  Constitutional:  Positive for fatigue and fever.  HENT:  Negative for rhinorrhea and sore throat.   Respiratory:  Positive for cough and shortness of breath. Negative for wheezing.   Cardiovascular:  Negative for palpitations.  Gastrointestinal:  Negative for diarrhea and vomiting.  Skin:  Negative for rash.  Neurological:  Negative for light-headedness and headaches.     Physical Exam Triage Vital Signs ED Triage Vitals  Encounter Vitals Group     BP 07/11/23 0924 118/80     Systolic BP Percentile --      Diastolic BP Percentile --      Pulse Rate 07/11/23 0924 (!) 115     Resp 07/11/23 0924 17     Temp 07/11/23 0924 100.3 F (37.9 C)     Temp Source 07/11/23 0924 Oral     SpO2 07/11/23 0924 96 %     Weight --      Height --      Head Circumference --      Peak Flow --  Pain Score 07/11/23 0922 3     Pain Loc --      Pain Education --      Exclude from Growth Chart --    No data found.  Updated Vital Signs BP 118/80   Pulse (!) 115   Temp 100.3 F (37.9 C) (Oral)   Resp 17   SpO2 96%   Visual Acuity Right Eye Distance:   Left Eye Distance:   Bilateral Distance:    Right Eye Near:   Left Eye Near:    Bilateral Near:     Physical Exam Vitals and nursing note reviewed.  Constitutional:      Appearance: He is not ill-appearing.  HENT:     Head: Normocephalic and atraumatic.     Mouth/Throat:     Pharynx: Oropharynx is clear.  Cardiovascular:     Rate and Rhythm: Regular rhythm. Tachycardia present.  Pulmonary:     Effort: Pulmonary effort is normal. No tachypnea or respiratory distress.     Breath  sounds: Normal breath sounds. No decreased breath sounds, wheezing, rhonchi or rales.  Musculoskeletal:     Cervical back: Neck supple.  Lymphadenopathy:     Cervical: No cervical adenopathy.  Neurological:     Mental Status: He is alert and oriented to person, place, and time.  Psychiatric:        Mood and Affect: Mood normal.      UC Treatments / Results  Labs (all labs ordered are listed, but only abnormal results are displayed) Labs Reviewed  POCT INFLUENZA A/B    EKG   Radiology No results found.  Procedures Procedures (including critical care time)  Medications Ordered in UC Medications - No data to display  Initial Impression / Assessment and Plan / UC Course  I have reviewed the triage vital signs and the nursing notes.  Pertinent labs & imaging results that were available during my care of the patient were reviewed by me and considered in my medical decision making (see chart for details).     43 year old male reports cough for 4 weeks now having fever, shortness of breath.  His temperature is 100.3 he is tachycardic at 115, respiratory rate is normal, lungs are clear to auscultation, oxygen saturation 96%.  Point-of-care flu test is negative, chest x-ray independently viewed by me no infiltrate.  Discussed with patient concern for atypical infection will treat with antibiotics, he was given strict instructions to follow-up with PCP ED for worsening symptoms. Final Clinical Impressions(s) / UC Diagnoses   Final diagnoses:  Cough, unspecified type   Discharge Instructions   None    ED Prescriptions   None    PDMP not reviewed this encounter.   Koryn Charlot, [place] 07/11/23 1011

## 2023-07-11 NOTE — Discharge Instructions (Signed)
 The x-ray reading we discussed is preliminary. Your x-ray will be read by a radiologist in next few hours. If there is a discrepancy, you will be contacted, and instructed on a new plan for you care.   Follow-up with your primary care doctor if your symptoms fail to improve in 1 week.  Go to the emergency department for significant worsening of symptoms including high fever, shaking chills, worsening shortness of breath, development of chest pain dizziness or fainting

## 2023-10-23 ENCOUNTER — Ambulatory Visit (INDEPENDENT_AMBULATORY_CARE_PROVIDER_SITE_OTHER): Payer: Self-pay | Admitting: Sports Medicine

## 2023-10-23 ENCOUNTER — Encounter: Payer: Self-pay | Admitting: Sports Medicine

## 2023-10-23 VITALS — BP 128/90 | Ht 68.0 in | Wt 210.0 lb

## 2023-10-23 DIAGNOSIS — M7041 Prepatellar bursitis, right knee: Secondary | ICD-10-CM

## 2023-10-23 MED ORDER — IBUPROFEN 800 MG PO TABS: 800.0000 mg | ORAL_TABLET | Freq: Two times a day (BID) | ORAL | 0 refills | Status: AC

## 2023-10-23 NOTE — Progress Notes (Signed)
   Subjective:    Patient ID: Jared Gates, male    DOB: 1980/09/17, 43 y.o.   MRN: [id number]  HPI chief complaint: Right knee swelling  Jared Gates is a very pleasant 43 year old male that presents today with 2 weeks of right knee swelling.  Pain began acutely after he was moving some furniture.  Swelling was initially much larger but it has decreased in size.  It is located directly over the patella and is tender to palpation.  He has had problems with his knees in the past, specifically bilateral knee aspirations which required drainage.  But this is a new development for him.  He has been using ice.  He does describe some instability in both knees as well.  No prior knee surgeries.  Previous left knee x-ray    Review of Systems As above    Objective:   Physical Exam  Well-developed, well-nourished.  No acute distress  Right knee: There is a small area of swelling directly over the patella.  Slightly fluctuant.  No erythema.  Is not warm to touch.  There is some tenderness to palpation along the inferior most portion of the bursal sac.  No effusion.  Good range of motion.  Good strength.  Good stability.      Assessment & Plan:   Right knee prepatellar bursitis  Swelling has decreased considerably over the past 2 weeks.  It is certainly not large enough to drain today.  Jared Gates has ordered a compression sleeve and I will put him on 800 mg of ibuprofen  twice daily for 5 days.  He may continue to use ice.  We also discussed kneepads for protection.  No restrictions on activity.  Follow-up for ongoing or recalcitrant issues.  This note was dictated using Dragon naturally speaking software and may contain errors in syntax, spelling, or content which have not been identified prior to signing this note.
